# Patient Record
Sex: Female | Born: 1990 | Hispanic: No | Marital: Married | Smoking: Former smoker
Health system: Southern US, Community
[De-identification: ages and names within clinical notes are randomized; demographics above are authoritative.]

## PROBLEM LIST (undated history)

## (undated) DIAGNOSIS — G8929 Other chronic pain: Secondary | ICD-10-CM

## (undated) DIAGNOSIS — B009 Herpesviral infection, unspecified: Secondary | ICD-10-CM

## (undated) DIAGNOSIS — B999 Unspecified infectious disease: Secondary | ICD-10-CM

## (undated) DIAGNOSIS — O99013 Anemia complicating pregnancy, third trimester: Principal | ICD-10-CM

## (undated) DIAGNOSIS — M549 Dorsalgia, unspecified: Secondary | ICD-10-CM

## (undated) HISTORY — PX: NO PAST SURGERIES: SHX2092

## (undated) HISTORY — DX: Anemia complicating pregnancy, third trimester: O99.013

---
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
## 2012-02-09 ENCOUNTER — Emergency Department (HOSPITAL_COMMUNITY)
Admission: EM | Admit: 2012-02-09 | Discharge: 2012-02-09 | Disposition: A | Discharge status: Home / Self Care | Payer: Self-pay | Attending: Emergency Medicine | Admitting: Emergency Medicine

## 2012-02-09 ENCOUNTER — Encounter (HOSPITAL_COMMUNITY): Payer: Self-pay | Admitting: *Deleted

## 2012-02-09 DIAGNOSIS — G8929 Other chronic pain: Secondary | ICD-10-CM | POA: Insufficient documentation

## 2012-02-09 DIAGNOSIS — M549 Dorsalgia, unspecified: Secondary | ICD-10-CM | POA: Insufficient documentation

## 2012-02-09 DIAGNOSIS — Z043 Encounter for examination and observation following other accident: Secondary | ICD-10-CM | POA: Insufficient documentation

## 2012-02-09 HISTORY — DX: Dorsalgia, unspecified: M54.9

## 2012-02-09 HISTORY — DX: Other chronic pain: G89.29

## 2012-02-09 MED ORDER — HYDROCODONE-ACETAMINOPHEN 5-325 MG PO TABS
1.0000 | ORAL_TABLET | Freq: Once | ORAL | Status: AC
  Administered 2012-02-09: 1 via ORAL
  Filled 2012-02-09: qty 1

## 2012-02-09 MED ORDER — HYDROCODONE-ACETAMINOPHEN 5-325 MG PO TABS: 1.0000 | ORAL_TABLET | Freq: Four times a day (QID) | ORAL | Status: AC | PRN
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
## 2012-02-09 NOTE — ED Notes (Signed)
Discharge inst given voiced understanding. 

## 2014-05-25 ENCOUNTER — Emergency Department (HOSPITAL_COMMUNITY): Payer: No Typology Code available for payment source

## 2014-05-25 ENCOUNTER — Emergency Department (HOSPITAL_COMMUNITY)
Admission: EM | Admit: 2014-05-25 | Discharge: 2014-05-26 | Disposition: A | Discharge status: Home / Self Care | Payer: No Typology Code available for payment source | Attending: Emergency Medicine | Admitting: Emergency Medicine

## 2014-05-25 ENCOUNTER — Encounter (HOSPITAL_COMMUNITY): Payer: Self-pay | Admitting: Emergency Medicine

## 2014-05-25 DIAGNOSIS — IMO0002 Reserved for concepts with insufficient information to code with codable children: Secondary | ICD-10-CM | POA: Insufficient documentation

## 2014-05-25 DIAGNOSIS — S060X0A Concussion without loss of consciousness, initial encounter: Secondary | ICD-10-CM | POA: Diagnosis not present

## 2014-05-25 DIAGNOSIS — R42 Dizziness and giddiness: Secondary | ICD-10-CM | POA: Insufficient documentation

## 2014-05-25 DIAGNOSIS — G8929 Other chronic pain: Secondary | ICD-10-CM | POA: Diagnosis not present

## 2014-05-25 DIAGNOSIS — S161XXA Strain of muscle, fascia and tendon at neck level, initial encounter: Secondary | ICD-10-CM

## 2014-05-25 DIAGNOSIS — Y9389 Activity, other specified: Secondary | ICD-10-CM | POA: Insufficient documentation

## 2014-05-25 DIAGNOSIS — S139XXA Sprain of joints and ligaments of unspecified parts of neck, initial encounter: Secondary | ICD-10-CM | POA: Insufficient documentation

## 2014-05-25 DIAGNOSIS — Y92410 Unspecified street and highway as the place of occurrence of the external cause: Secondary | ICD-10-CM | POA: Diagnosis not present

## 2014-05-25 DIAGNOSIS — R110 Nausea: Secondary | ICD-10-CM | POA: Diagnosis not present

## 2014-05-25 DIAGNOSIS — S0990XA Unspecified injury of head, initial encounter: Secondary | ICD-10-CM | POA: Diagnosis present

## 2014-05-25 MED ORDER — KETOROLAC TROMETHAMINE 60 MG/2ML IM SOLN
60.0000 mg | Freq: Once | INTRAMUSCULAR | Status: AC
  Administered 2014-05-26: 60 mg via INTRAMUSCULAR
  Filled 2014-05-25: qty 2

## 2014-05-25 MED ORDER — HYDROCODONE-ACETAMINOPHEN 5-325 MG PO TABS
1.0000 | ORAL_TABLET | Freq: Once | ORAL | Status: AC
  Administered 2014-05-26: 1 via ORAL
  Filled 2014-05-25: qty 1
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
## 2014-05-25 NOTE — ED Notes (Signed)
Pt reports headache, facial pain, a pain in the back of her head and neck. Pt reports numbness and tingling, lower back pain. Pt reports pain 9/10. Pt denies taking any medications prior to arrival. Pt sitting in dark room and reports nausea. Pt given emesis bag.
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
## 2014-05-26 MED ORDER — CYCLOBENZAPRINE HCL 10 MG PO TABS: 10.0000 mg | ORAL_TABLET | Freq: Two times a day (BID) | ORAL | Status: DC | PRN

## 2014-05-26 MED ORDER — NAPROXEN 500 MG PO TABS
500.0000 mg | ORAL_TABLET | Freq: Two times a day (BID) | ORAL | Status: DC
Start: 2014-05-26 — End: 2014-07-22

## 2014-05-26 NOTE — ED Notes (Signed)
Discharge instructions reviewed with pt. Pt verbalized understanding.   
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
## 2014-05-27 NOTE — ED Provider Notes (Signed)
Medical screening examination/treatment/procedure(s) were performed by non-physician practitioner and as supervising physician I was immediately available for consultation/collaboration.   EKG Interpretation None        Richardean Canalavid H Harlowe Dowler, MD 05/27/14 [phone]

## 2014-06-05 ENCOUNTER — Emergency Department (HOSPITAL_COMMUNITY)
Admission: EM | Admit: 2014-06-05 | Discharge: 2014-06-05 | Disposition: A | Discharge status: Home / Self Care | Payer: No Typology Code available for payment source | Attending: Emergency Medicine | Admitting: Emergency Medicine

## 2014-06-05 ENCOUNTER — Encounter (HOSPITAL_COMMUNITY): Payer: Self-pay | Admitting: Emergency Medicine

## 2014-06-05 DIAGNOSIS — M549 Dorsalgia, unspecified: Secondary | ICD-10-CM | POA: Diagnosis not present

## 2014-06-05 DIAGNOSIS — Z791 Long term (current) use of non-steroidal anti-inflammatories (NSAID): Secondary | ICD-10-CM | POA: Diagnosis not present

## 2014-06-05 DIAGNOSIS — R519 Headache, unspecified: Secondary | ICD-10-CM

## 2014-06-05 DIAGNOSIS — M62838 Other muscle spasm: Secondary | ICD-10-CM | POA: Diagnosis not present

## 2014-06-05 DIAGNOSIS — G8929 Other chronic pain: Secondary | ICD-10-CM | POA: Insufficient documentation

## 2014-06-05 DIAGNOSIS — R51 Headache: Secondary | ICD-10-CM | POA: Diagnosis present

## 2014-06-05 DIAGNOSIS — Z79899 Other long term (current) drug therapy: Secondary | ICD-10-CM | POA: Insufficient documentation

## 2014-06-05 DIAGNOSIS — M542 Cervicalgia: Secondary | ICD-10-CM | POA: Diagnosis not present

## 2014-06-05 MED ORDER — IBUPROFEN 800 MG PO TABS: 800.0000 mg | ORAL_TABLET | Freq: Three times a day (TID) | ORAL | Status: DC | PRN

## 2014-06-05 MED ORDER — DIAZEPAM 5 MG PO TABS: 5.0000 mg | ORAL_TABLET | Freq: Three times a day (TID) | ORAL | Status: DC | PRN

## 2014-06-05 MED ORDER — KETOROLAC TROMETHAMINE 30 MG/ML IJ SOLN
30.0000 mg | Freq: Once | INTRAMUSCULAR | Status: AC
  Administered 2014-06-05: 30 mg via INTRAMUSCULAR
  Filled 2014-06-05: qty 1

## 2014-06-05 NOTE — ED Notes (Signed)
Declined W/C at D/C and was escorted to lobby by RN. 
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
## 2014-06-05 NOTE — ED Provider Notes (Signed)
Medical screening examination/treatment/procedure(s) were performed by non-physician practitioner and as supervising physician I was immediately available for consultation/collaboration  Morena Mckissack E Lenny Bouchillon, MD 06/05/14 1102 
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
## 2014-07-22 ENCOUNTER — Inpatient Hospital Stay (HOSPITAL_COMMUNITY)
Admission: AD | Admit: 2014-07-22 | Discharge: 2014-07-22 | Disposition: A | Discharge status: Home / Self Care | Payer: Medicaid Other | Source: Ambulatory Visit | Attending: Obstetrics & Gynecology | Admitting: Obstetrics & Gynecology

## 2014-07-22 ENCOUNTER — Encounter (HOSPITAL_COMMUNITY): Payer: Self-pay | Admitting: *Deleted

## 2014-07-22 ENCOUNTER — Inpatient Hospital Stay (HOSPITAL_COMMUNITY): Payer: Medicaid Other

## 2014-07-22 DIAGNOSIS — O209 Hemorrhage in early pregnancy, unspecified: Secondary | ICD-10-CM | POA: Diagnosis not present

## 2014-07-22 DIAGNOSIS — O4690 Antepartum hemorrhage, unspecified, unspecified trimester: Secondary | ICD-10-CM

## 2014-07-22 LAB — CBC WITH DIFFERENTIAL/PLATELET
BASOS PCT: 0 % (ref 0–1)
Basophils Absolute: 0 10*3/uL (ref 0.0–0.1)
EOS ABS: 0.1 10*3/uL (ref 0.0–0.7)
EOS PCT: 2 % (ref 0–5)
HEMATOCRIT: 36.4 % (ref 36.0–46.0)
HEMOGLOBIN: 12.4 g/dL (ref 12.0–15.0)
Lymphocytes Relative: 26 % (ref 12–46)
Lymphs Abs: 1.4 10*3/uL (ref 0.7–4.0)
MCH: 31.2 pg (ref 26.0–34.0)
MCHC: 34.1 g/dL (ref 30.0–36.0)
MCV: 91.5 fL (ref 78.0–100.0)
MONO ABS: 0.4 10*3/uL (ref 0.1–1.0)
MONOS PCT: 7 % (ref 3–12)
Neutro Abs: 3.7 10*3/uL (ref 1.7–7.7)
Neutrophils Relative %: 65 % (ref 43–77)
Platelets: 196 10*3/uL (ref 150–400)
RBC: 3.98 MIL/uL (ref 3.87–5.11)
RDW: 12.6 % (ref 11.5–15.5)
WBC: 5.6 10*3/uL (ref 4.0–10.5)

## 2014-07-22 LAB — ABO/RH: ABO/RH(D): B POS

## 2014-07-22 LAB — WET PREP, GENITAL
CLUE CELLS WET PREP: NONE SEEN
TRICH WET PREP: NONE SEEN
YEAST WET PREP: NONE SEEN

## 2014-07-22 LAB — POCT PREGNANCY, URINE: Preg Test, Ur: POSITIVE — AB

## 2014-07-22 LAB — HCG, QUANTITATIVE, PREGNANCY: hCG, Beta Chain, Quant, S: 343 m[IU]/mL — ABNORMAL HIGH (ref <5)

## 2014-07-22 NOTE — MAU Provider Note (Signed)
History     CSN: [id number]  Arrival date and time: 07/22/14 1123   First Provider Initiated Contact with Patient 07/22/14 1202      Chief Complaint  Patient presents with  . Threatened Miscarriage   HPI  Pt, Tracy Barr [redacted]w[redacted]d, is a 23 y.o. G57P0010 female with a one week history vaginal bleeding. Pt states that she began spotting about a week ago. Pt noticed an increase in the amount of bleeding within the past few days and also began to have cramps in her pelvic region and lower back. This morning pt states that she had a large amount of bleeding and she also noted the passage of what she thought to be blood clots. Pt has not had bleeding since this morning. She states pain has also improved since this morning and rates pain at 3/10 now. She denies fever, N/V/D or UTI symptoms.   OB History   Grav Para Term Preterm Abortions TAB SAB Ect Mult Living   2 0   1 1          Past Medical History  Diagnosis Date  . Chronic back pain     Past Surgical History  Procedure Laterality Date  . No past surgeries      History reviewed. No pertinent family history.  History  Substance Use Topics  . Smoking status: Never Smoker   . Smokeless tobacco: Not on file  . Alcohol Use: No    Allergies: No Known Allergies  Prescriptions prior to admission  Medication Sig Dispense Refill  . Prenatal Vit-Fe Fumarate-FA (PRENATAL MULTIVITAMIN) TABS tablet Take 1 tablet by mouth daily at 12 noon.        Review of Systems  Constitutional: Negative for fever, chills, malaise/fatigue and diaphoresis.  Eyes: Negative for blurred vision and double vision.  Respiratory: Negative for cough and shortness of breath.   Cardiovascular: Negative for chest pain and palpitations.  Gastrointestinal: Positive for abdominal pain. Negative for nausea, vomiting, diarrhea and constipation.  Genitourinary: Negative for dysuria, urgency and frequency.  Neurological: Negative for dizziness, weakness and  headaches.   Physical Exam   Blood pressure 103/65, pulse 71, temperature 98.6 F (37 C), temperature source Oral, resp. rate 18, height  (1.626 m), weight 139 lb 12.8 oz (63.413 kg), last menstrual period 05/19/2014.  Physical Exam  Constitutional: She is oriented to person, place, and time. She appears well-developed and well-nourished.  HENT:  Head: Normocephalic and atraumatic.  Cardiovascular: Normal rate, regular rhythm, normal heart sounds and intact distal pulses.   No murmur heard. Respiratory: Effort normal and breath sounds normal. No respiratory distress.  GI: Soft. She exhibits no distension. There is tenderness. There is guarding.  Some reflexive guarding noted upon deep palpation of the lower quadrants  Genitourinary: Uterus is enlarged (slightly). Uterus is not tender. Cervix exhibits no motion tenderness, no discharge and no friability. Right adnexum displays no mass and no tenderness. Left adnexum displays no mass and no tenderness. There is bleeding (small blood noted in the vaginal vault) around the vagina. No vaginal discharge found.  Neurological: She is alert and oriented to person, place, and time.  Skin: Skin is warm.  Psychiatric: Thought content normal.   Results for orders placed during the hospital encounter of 07/22/14 (from the past 24 hour(s))  POCT PREGNANCY, URINE     Status: Abnormal   Collection Time    07/22/14 12:04 PM      Result Value Ref Range  Preg Test, Ur POSITIVE (*) NEGATIVE  WET PREP, GENITAL     Status: Abnormal   Collection Time    07/22/14 12:10 PM      Result Value Ref Range   Yeast Wet Prep HPF POC NONE SEEN  NONE SEEN   Trich, Wet Prep NONE SEEN  NONE SEEN   Clue Cells Wet Prep HPF POC NONE SEEN  NONE SEEN   WBC, Wet Prep HPF POC FEW (*) NONE SEEN  CBC WITH DIFFERENTIAL     Status: None   Collection Time    07/22/14 12:25 PM      Result Value Ref Range   WBC 5.6  4.0 - 10.5 K/uL   RBC 3.98  3.87 - 5.11 MIL/uL    Hemoglobin 12.4  12.0 - 15.0 g/dL   HCT 16.1  09.6 - 04.5 %   MCV 91.5  78.0 - 100.0 fL   MCH 31.2  26.0 - 34.0 pg   MCHC 34.1  30.0 - 36.0 g/dL   RDW 40.9  81.1 - 91.4 %   Platelets 196  150 - 400 K/uL   Neutrophils Relative % 65  43 - 77 %   Neutro Abs 3.7  1.7 - 7.7 K/uL   Lymphocytes Relative 26  12 - 46 %   Lymphs Abs 1.4  0.7 - 4.0 K/uL   Monocytes Relative 7  3 - 12 %   Monocytes Absolute 0.4  0.1 - 1.0 K/uL   Eosinophils Relative 2  0 - 5 %   Eosinophils Absolute 0.1  0.0 - 0.7 K/uL   Basophils Relative 0  0 - 1 %   Basophils Absolute 0.0  0.0 - 0.1 K/uL  ABO/RH     Status: None   Collection Time    07/22/14 12:25 PM      Result Value Ref Range   ABO/RH(D) B POS    HCG, QUANTITATIVE, PREGNANCY     Status: Abnormal   Collection Time    07/22/14 12:25 PM      Result Value Ref Range   hCG, Beta Chain, Quant, S 343 (*) <5 mIU/mL   US Ob Comp Less 14 Wks  07/22/2014   CLINICAL DATA:  Pelvic and back pain.  Vaginal bleeding.  EXAM: OBSTETRIC <14 WK [place] AND TRANSVAGINAL OB US  TECHNIQUE: Both transabdominal and transvaginal ultrasound examinations were performed for complete evaluation of the gestation as well as the maternal uterus, adnexal regions, and pelvic cul-de-sac. Transvaginal technique was performed to assess early pregnancy.  COMPARISON:  None.  FINDINGS: No intrauterine gestational sac or other fluid collection visualized in endometrial cavity. Endometrial thickness measures approximately 6 mm transvaginally.  Both ovaries are normal in appearance. No adnexal mass or free fluid identified.  IMPRESSION: No IUP or adnexal mass visualized. Differential diagnosis includes recent spontaneous abortion, IUP too early to visualize, and occult ectopic pregnancy. Recommend close follow up of quantitative B-HCG levels, and follow up US as clinically warranted.   Electronically Signed   By: Myles Rosenthal M.D.   On: 07/22/2014 13:54   US Ob Transvaginal  07/22/2014   CLINICAL DATA:   Pelvic and back pain.  Vaginal bleeding.  EXAM: OBSTETRIC <14 WK [place] AND TRANSVAGINAL OB US  TECHNIQUE: Both transabdominal and transvaginal ultrasound examinations were performed for complete evaluation of the gestation as well as the maternal uterus, adnexal regions, and pelvic cul-de-sac. Transvaginal technique was performed to assess early pregnancy.  COMPARISON:  None.  FINDINGS: No intrauterine  gestational sac or other fluid collection visualized in endometrial cavity. Endometrial thickness measures approximately 6 mm transvaginally.  Both ovaries are normal in appearance. No adnexal mass or free fluid identified.  IMPRESSION: No IUP or adnexal mass visualized. Differential diagnosis includes recent spontaneous abortion, IUP too early to visualize, and occult ectopic pregnancy. Recommend close follow up of quantitative B-HCG levels, and follow up US as clinically warranted.   Electronically Signed   By: Myles Rosenthal M.D.   On: 07/22/2014 13:54    MAU Course  Procedures None  MDM +UPT UA, wet prep, GC/Chlamydia, CBC, ABO/Rh, quant hCG, HIV and [place] today  Azucena Cecil, PA-S2 07/22/2014, 12:02 PM  Assessment and Plan  A: Vaginal bleeding in pregnancy prior to [redacted] weeks gestation  P: Discharge home Bleeding precautions discussed Patient to follow-up in MAU in 48 hours for repeat labs Patient may return to MAU as needed or if her condition were to change or worsen  I have seen and evaluated the patient with the NP/PA/Med student. I agree with the assessment and plan as written above.   Marny Lowenstein, PA-C  07/22/2014 2:47 PM

## 2014-07-22 NOTE — Discharge Instructions (Signed)
Pelvic Rest °Pelvic rest is sometimes recommended for women when:  °· The placenta is partially or completely covering the opening of the cervix (placenta previa). °· There is bleeding between the uterine wall and the amniotic sac in the first trimester (subchorionic hemorrhage). °· The cervix begins to open without labor starting (incompetent cervix, cervical insufficiency). °· The labor is too early (preterm labor). °HOME CARE INSTRUCTIONS °· Do not have sexual intercourse, stimulation, or an orgasm. °· Do not use tampons, douche, or put anything in the vagina. °· Do not lift anything over 10 pounds (4.5 kg). °· Avoid strenuous activity or straining your pelvic muscles. °SEEK MEDICAL CARE IF:  °· You have any vaginal bleeding during pregnancy. Treat this as a potential emergency. °· You have cramping pain felt low in the stomach (stronger than menstrual cramps). °· You notice vaginal discharge (watery, mucus, or bloody). °· You have a low, dull backache. °· There are regular contractions or uterine tightening. °SEEK IMMEDIATE MEDICAL CARE IF: °You have vaginal bleeding and have placenta previa.  °Document Released: 02/16/2011 Document Revised: 01/14/2012 Document Reviewed: 02/16/2011 °ExitCare® Patient Information ©2015 [employer]. This information is not intended to replace advice given to you by your health care provider. Make sure you discuss any questions you have with your health care provider. ° °Threatened Miscarriage °A threatened miscarriage is when you have vaginal bleeding during your first 20 weeks of pregnancy but the pregnancy has not ended. Your doctor will do tests to make sure you are still pregnant. The cause of the bleeding may not be known. This condition does not mean your pregnancy will end. It does increase the risk of it ending (complete miscarriage). °HOME CARE  °· Make sure you keep all your doctor visits for prenatal care. °· Get plenty of rest. °· Do not have sex or use tampons if  you have vaginal bleeding. °· Do not douche. °· Do not smoke or use drugs. °· Do not drink alcohol. °· Avoid caffeine. °GET HELP IF: °· You have light bleeding from your vagina. °· You have belly pain or cramping. °· You have a fever. °GET HELP RIGHT AWAY IF:  °· You have heavy bleeding from your vagina. °· You have clots of blood coming from your vagina. °· You have bad pain or cramps in your low back or belly. °· You have fever, chills, and bad belly pain. °MAKE SURE YOU:  °· Understand these instructions. °· Will watch your condition. °· Will get help right away if you are not doing well or get worse. °Document Released: 10/04/2008 Document Revised: 10/27/2013 Document Reviewed: 08/18/2013 °ExitCare® Patient Information ©2015 [employer]. This information is not intended to replace advice given to you by your health care provider. Make sure you discuss any questions you have with your health care provider. ° °

## 2014-07-22 NOTE — MAU Provider Note (Signed)
Attestation of Attending Supervision of Advanced Practitioner (CNM/NP): Evaluation and management procedures were performed by the Advanced Practitioner under my supervision and collaboration.  I have reviewed the Advanced Practitioner's note and chart, and I agree with the management and plan.  HARRAWAY-SMITH, Mila Pair 5:12 PM     

## 2014-07-22 NOTE — MAU Note (Signed)
[facility]/14. Started having some spotting a week ago. Bleeding got a liltle heavier and stred having some cramping increased yesterday. Feels like she passed something this morning.

## 2014-07-23 LAB — GC/CHLAMYDIA PROBE AMP
CT Probe RNA: NEGATIVE
GC Probe RNA: NEGATIVE

## 2014-07-23 LAB — HIV ANTIBODY (ROUTINE TESTING W REFLEX): HIV: NONREACTIVE

## 2014-07-24 ENCOUNTER — Inpatient Hospital Stay (HOSPITAL_COMMUNITY)
Admission: AD | Admit: 2014-07-24 | Discharge: 2014-07-24 | Disposition: A | Discharge status: Home / Self Care | Payer: Medicaid Other | Source: Ambulatory Visit | Attending: Obstetrics & Gynecology | Admitting: Obstetrics & Gynecology

## 2014-07-24 DIAGNOSIS — O039 Complete or unspecified spontaneous abortion without complication: Secondary | ICD-10-CM | POA: Insufficient documentation

## 2014-07-24 DIAGNOSIS — O4690 Antepartum hemorrhage, unspecified, unspecified trimester: Secondary | ICD-10-CM

## 2014-07-24 DIAGNOSIS — O209 Hemorrhage in early pregnancy, unspecified: Secondary | ICD-10-CM

## 2014-07-24 LAB — HCG, QUANTITATIVE, PREGNANCY: hCG, Beta Chain, Quant, S: 86 m[IU]/mL — ABNORMAL HIGH (ref <5)

## 2014-07-24 NOTE — MAU Note (Signed)
Pt here for repeat BHCG. Intermittent mild cramping, still bleeding, however has lessened.

## 2014-07-24 NOTE — MAU Provider Note (Signed)
  History     CSN: [id number]  Arrival date and time: 07/24/14 1209   None     Chief Complaint  Patient presents with  . Follow-Up BHCG    HPI Tracy Barr is a 23 y.o. G2P0010 at [redacted]w[redacted]d. Her first MAU visit was 9/17 for bleeding and cramping. BHCG was 343, U/S showed no IUGS or adnexal masses. She returns for repeat BHCG today. She has light bleeding and mild cramping. OB History   Grav Para Term Preterm Abortions TAB SAB Ect Mult Living   2 0   1 1          Past Medical History  Diagnosis Date  . Chronic back pain     Past Surgical History  Procedure Laterality Date  . No past surgeries      No family history on file.  History  Substance Use Topics  . Smoking status: Never Smoker   . Smokeless tobacco: Not on file  . Alcohol Use: No    Allergies: No Known Allergies  Prescriptions prior to admission  Medication Sig Dispense Refill  . Prenatal Vit-Fe Fumarate-FA (PRENATAL MULTIVITAMIN) TABS tablet Take 1 tablet by mouth daily at 12 noon.        Review of Systems  Constitutional: Negative for fever and chills.  Genitourinary:       Occ cramping, light spotting  Neurological: Negative for dizziness.   Physical Exam   Blood pressure 110/59, pulse 80, temperature 98.8 F (37.1 C), temperature source Oral, resp. rate 16, height  (1.626 m), weight 64.071 kg (141 lb 4 oz), last menstrual period 05/19/2014.  Physical Exam  Nursing note and vitals reviewed. Constitutional: She is oriented to person, place, and time. She appears well-developed and well-nourished.  Musculoskeletal: Normal range of motion.  Neurological: She is alert and oriented to person, place, and time.  Skin: Skin is warm and dry.  Psychiatric: She has a normal mood and affect. Her behavior is normal.    MAU Course  Procedures  MDM Results for orders placed during the hospital encounter of 07/24/14 (from the past 24 hour(s))  HCG, QUANTITATIVE, PREGNANCY     Status: Abnormal   Collection Time    07/24/14 12:19 PM      Result Value Ref Range   hCG, Beta Chain, Quant, S 86 (*) <5 mIU/mL     Assessment and Plan  Spont ab    F/U in GYN clinic next Monday for BHCG Precautions reviewed Continue PNV  Anjalee Cope M. 07/24/2014, 1:05 PM

## 2014-08-02 ENCOUNTER — Other Ambulatory Visit: Payer: Medicaid Other

## 2014-08-02 DIAGNOSIS — O039 Complete or unspecified spontaneous abortion without complication: Secondary | ICD-10-CM

## 2014-08-02 LAB — HCG, QUANTITATIVE, PREGNANCY: hCG, Beta Chain, Quant, S: 2.79 m[IU]/mL

## 2014-08-04 ENCOUNTER — Telehealth: Payer: Self-pay | Admitting: General Practice

## 2014-08-04 NOTE — Telephone Encounter (Signed)
Message copied by Kathee DeltonHILLMAN, CARRIE L on Wed Aug 04, 2014  9:51 AM ------      Message from: CONSTANT, PEGGY      Created: Wed Aug 04, 2014  7:33 AM       PLease inform patient of complete resolution of pregnancy            Thanks            Peggy ------

## 2014-08-04 NOTE — Telephone Encounter (Signed)
Called patient and informed her of results. Patient verbalized understanding and had no other questions 

## 2014-08-04 NOTE — Telephone Encounter (Signed)
Called patient, no answer- left message that we are trying to reach you with some results, nothing urgent but please call us back at the clinics 

## 2014-09-06 ENCOUNTER — Encounter (HOSPITAL_COMMUNITY): Payer: Self-pay | Admitting: *Deleted

## 2014-11-05 NOTE — L&D Delivery Note (Signed)
Delivery Note 1954: Nurse call requests assistance.  In room and patient laying in bed on side.  Introductions made and patient with urge to push.  Infant noted at introitus and given option to birth in bed or tub.  Patient into tub and delivered as below with staff support and family encouragement.   At 8:31 PM a viable female "[name]" was delivered via Vaginal, Spontaneous Delivery in the WB tub (Presentation: Left Occiput Anterior with compound right hand).  Shoulders delivered easily and infant with good tone and spontaneous cry. Tactile stimulation given by provider and infant placed on mother's abdomen where nurse continued tactile stimulation. Infant APGAR: 8, 9. Mother guided out of tub, dried, and assisted into bed.  Cord clamped, cut, and blood collected. Placenta delivered spontaneously and noted to be intact with 3VC upon inspection.  Vaginal inspection revealed a right periurethral and left labial lacerations that was repaired with 3-0 vicryl on SH needle.  Patient straight catheterized prior to procedure with ~34950mL of clear yellow urine noted. Lidocaine 1% was utilized for local anesthetic and patient tolerated the procedure well. Fundus firm, at the umbilicus, and bleeding small.  Mother hemodynamically stable and infant skin to skin prior to provider exit.  Mother desires NFP for birth control and opts to breastfeed.  Infant weight at one hour of life: 7lbs 4.4oz, 20in  Anesthesia: Local  Episiotomy: None Lacerations: Labial Suture Repair: 3.0 vicryl Est. Blood Loss (mL): 150  Mom to postpartum.  Baby to Couplet care / Skin to Skin.  Tracy Blust LYNN MSN, CNM 09/18/2015, 10:10 PM

## 2015-02-22 LAB — OB RESULTS CONSOLE GC/CHLAMYDIA
Chlamydia: NEGATIVE
Gonorrhea: NEGATIVE

## 2015-02-22 LAB — OB RESULTS CONSOLE RPR: RPR: NONREACTIVE

## 2015-02-22 LAB — OB RESULTS CONSOLE HIV ANTIBODY (ROUTINE TESTING): HIV: NONREACTIVE

## 2015-02-22 LAB — OB RESULTS CONSOLE HEPATITIS B SURFACE ANTIGEN: HEP B S AG: NEGATIVE

## 2015-02-22 LAB — OB RESULTS CONSOLE RUBELLA ANTIBODY, IGM: RUBELLA: IMMUNE

## 2015-04-20 IMAGING — US US OB COMP LESS 14 WK
1 series · 14 of 28 positions shown · non-contrast
Comparison: None.

CLINICAL DATA: Pelvic and back pain.  Vaginal bleeding.

EXAM:
OBSTETRIC <14 WK US AND TRANSVAGINAL OB US
TECHNIQUE: Both transabdominal and transvaginal ultrasound examinations were
performed for complete evaluation of the gestation as well as the
maternal uterus, adnexal regions, and pelvic cul-de-sac.
Transvaginal technique was performed to assess early pregnancy.

[Series 1: us ob comp less 14 wks · 14 of 68 slices shown]
[im 3/68]
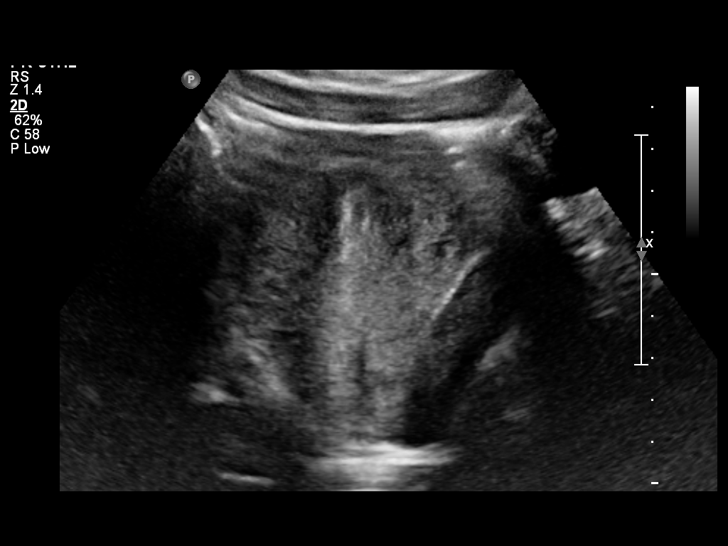
[im 8/68]
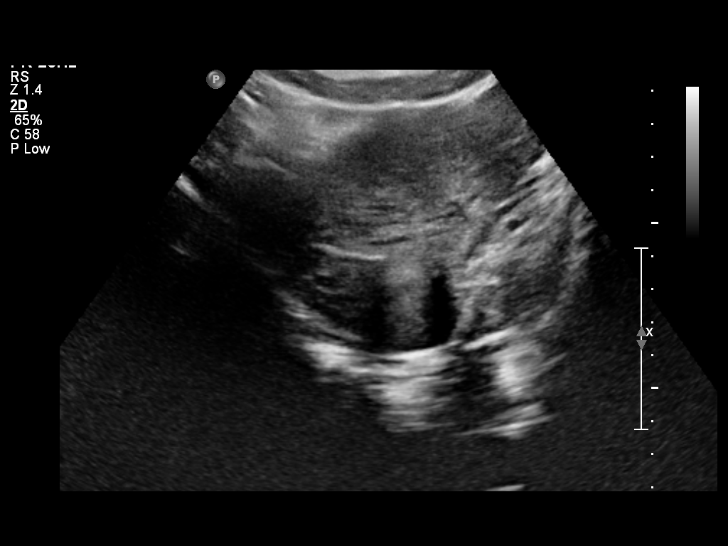
[im 13/68]
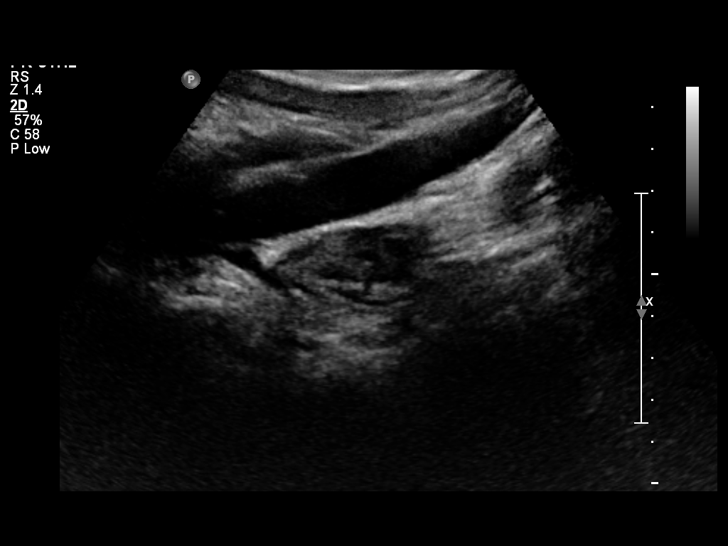
[im 18/68]
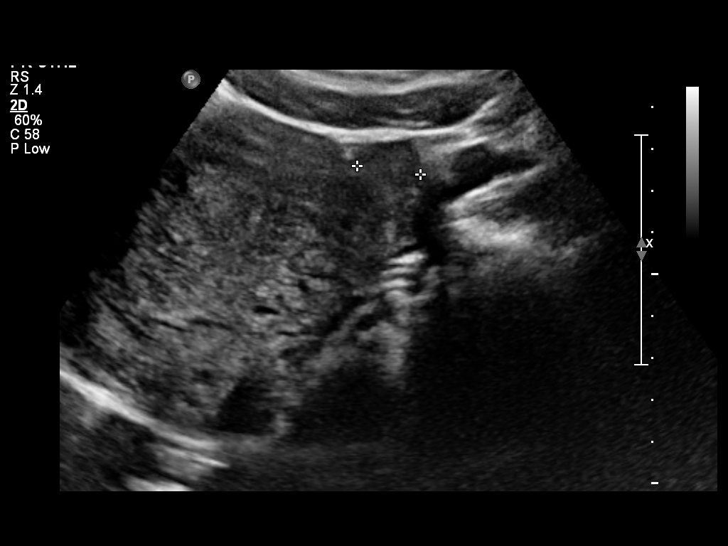
[im 23/68]
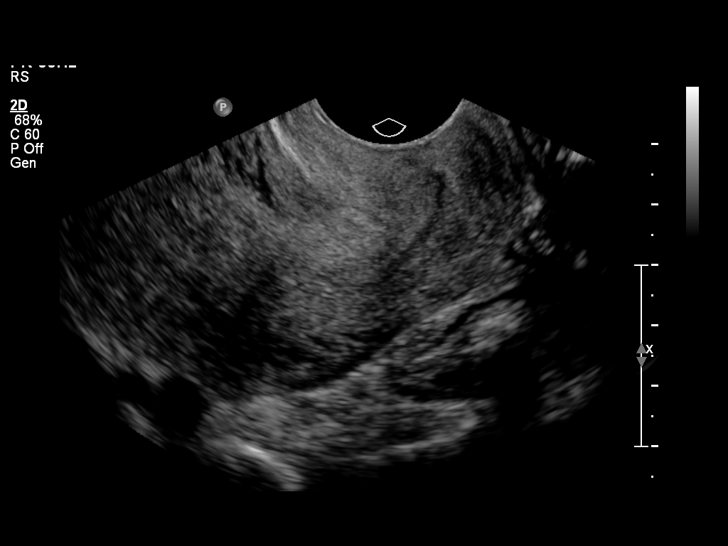
[im 28/68]
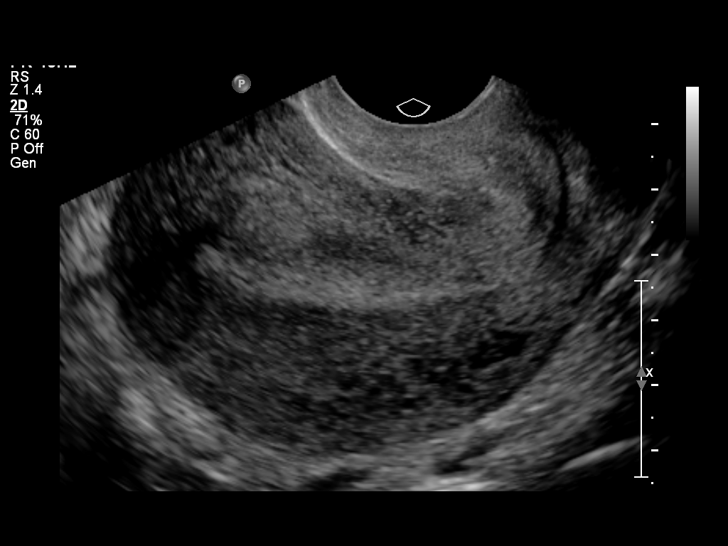
[im 33/68]
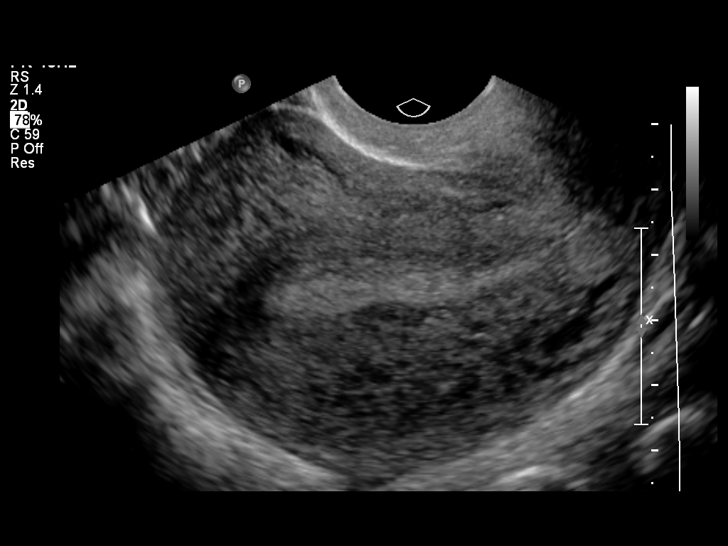
[im 38/68]
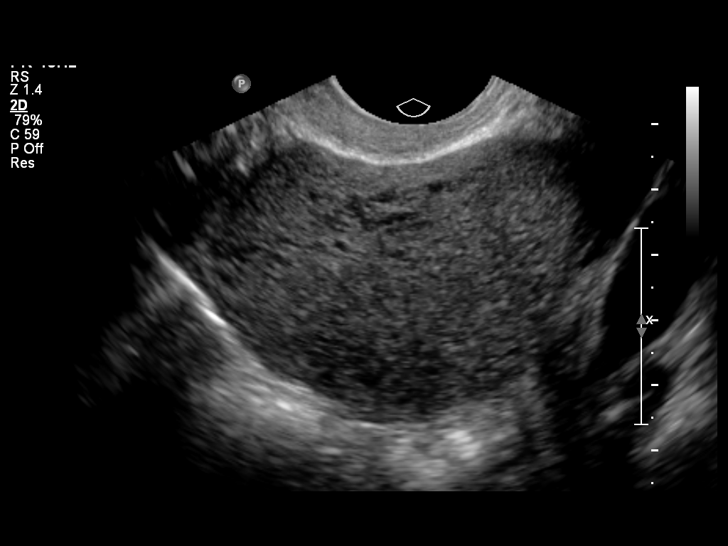
[im 43/68]
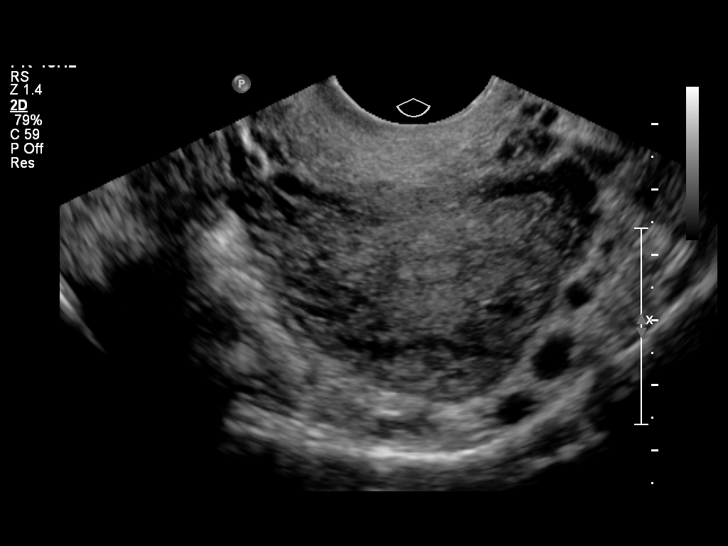
[im 48/68]
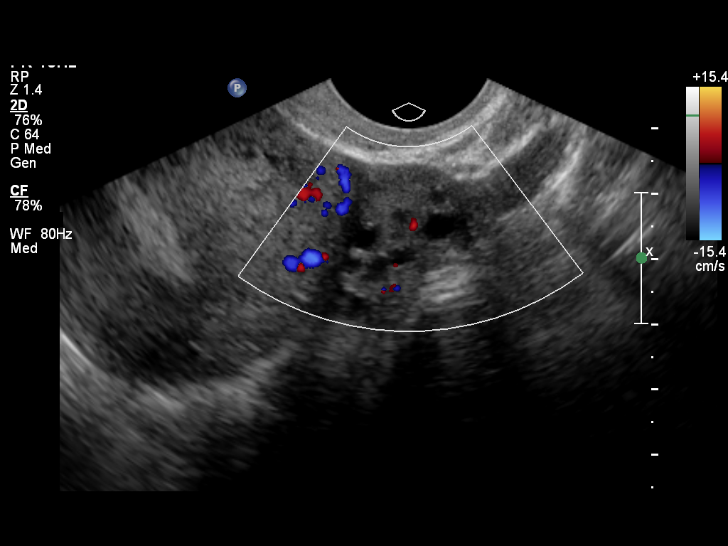
[im 53/68]
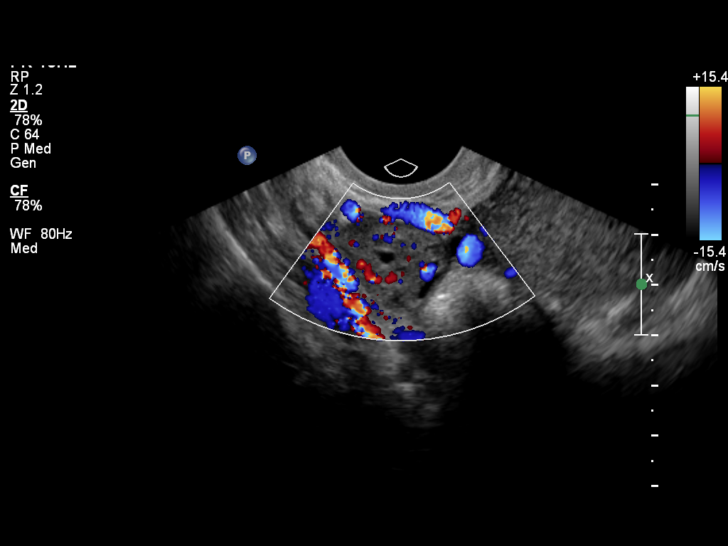
[im 58/68]
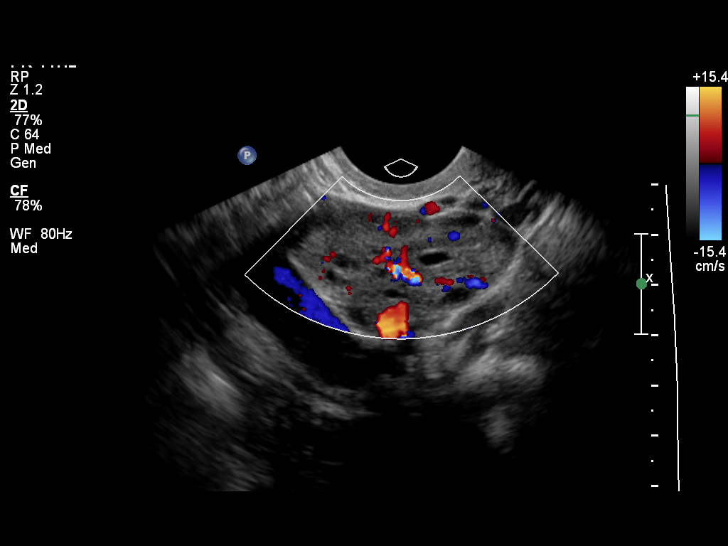
[im 63/68]
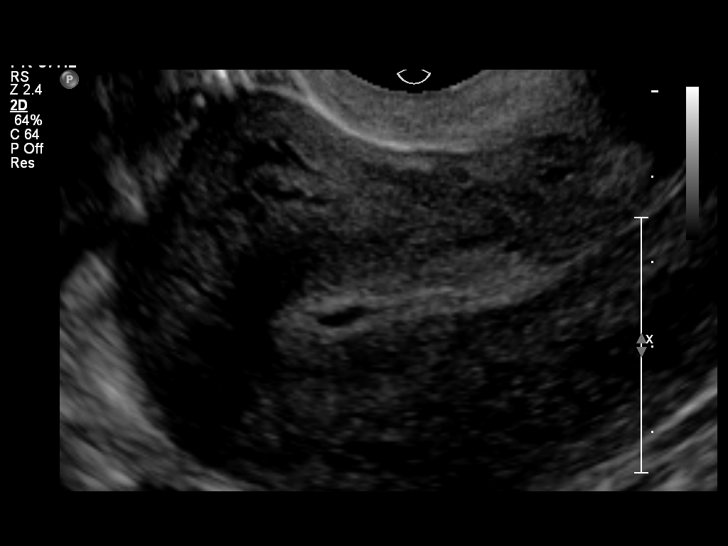
[im 68/68]
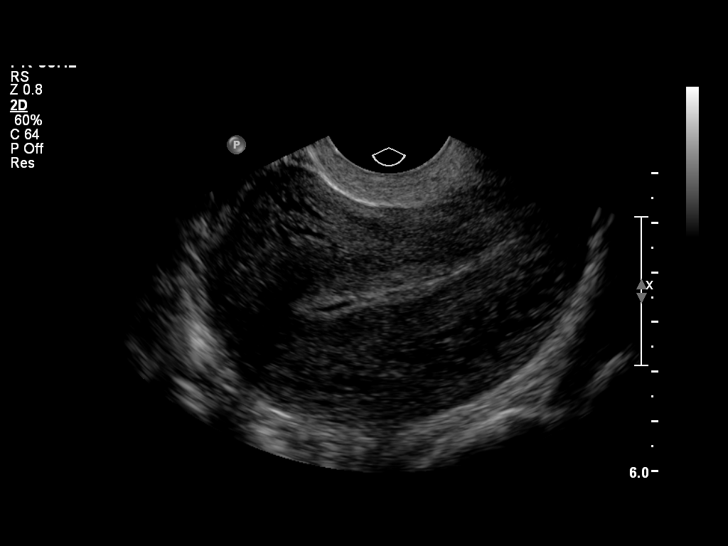

[14 of 28 positions shown; findings below may reference images not displayed]

FINDINGS: No intrauterine gestational sac or other fluid collection visualized
in endometrial cavity. Endometrial thickness measures approximately
6 mm transvaginally.

Both ovaries are normal in appearance. No adnexal mass or free fluid
identified.
IMPRESSION: No IUP or adnexal mass visualized. Differential diagnosis includes
recent spontaneous abortion, IUP too early to visualize, and occult
ectopic pregnancy. Recommend close follow up of quantitative B-HCG
levels, and follow up US as clinically warranted.

## 2015-05-27 ENCOUNTER — Encounter (HOSPITAL_COMMUNITY): Payer: Self-pay | Admitting: *Deleted

## 2015-07-21 LAB — OB RESULTS CONSOLE GBS: GBS: POSITIVE

## 2015-09-18 ENCOUNTER — Inpatient Hospital Stay (HOSPITAL_COMMUNITY)
Admission: AD | Admit: 2015-09-18 | Discharge: 2015-09-20 | DRG: 774 | Disposition: A | Discharge status: Home / Self Care | Payer: Medicaid Other | Attending: Obstetrics and Gynecology | Admitting: Obstetrics and Gynecology

## 2015-09-18 ENCOUNTER — Encounter (HOSPITAL_COMMUNITY): Payer: Self-pay | Admitting: *Deleted

## 2015-09-18 DIAGNOSIS — Z833 Family history of diabetes mellitus: Secondary | ICD-10-CM | POA: Diagnosis not present

## 2015-09-18 DIAGNOSIS — Z87891 Personal history of nicotine dependence: Secondary | ICD-10-CM | POA: Diagnosis not present

## 2015-09-18 DIAGNOSIS — B951 Streptococcus, group B, as the cause of diseases classified elsewhere: Secondary | ICD-10-CM | POA: Diagnosis present

## 2015-09-18 DIAGNOSIS — O480 Post-term pregnancy: Secondary | ICD-10-CM | POA: Diagnosis present

## 2015-09-18 DIAGNOSIS — B009 Herpesviral infection, unspecified: Secondary | ICD-10-CM | POA: Diagnosis present

## 2015-09-18 DIAGNOSIS — O9852 Other viral diseases complicating childbirth: Secondary | ICD-10-CM | POA: Diagnosis present

## 2015-09-18 DIAGNOSIS — Z8249 Family history of ischemic heart disease and other diseases of the circulatory system: Secondary | ICD-10-CM | POA: Diagnosis not present

## 2015-09-18 DIAGNOSIS — Z3A40 40 weeks gestation of pregnancy: Secondary | ICD-10-CM

## 2015-09-18 DIAGNOSIS — O99824 Streptococcus B carrier state complicating childbirth: Secondary | ICD-10-CM | POA: Diagnosis present

## 2015-09-18 HISTORY — DX: Unspecified infectious disease: B99.9

## 2015-09-18 HISTORY — DX: Herpesviral infection, unspecified: B00.9

## 2015-09-18 LAB — TYPE AND SCREEN
ABO/RH(D): B POS
Antibody Screen: NEGATIVE

## 2015-09-18 LAB — RPR: RPR: NONREACTIVE

## 2015-09-18 LAB — CBC
HCT: 34.7 % — ABNORMAL LOW (ref 36.0–46.0)
Hemoglobin: 12.1 g/dL (ref 12.0–15.0)
MCH: 32.5 pg (ref 26.0–34.0)
MCHC: 34.9 g/dL (ref 30.0–36.0)
MCV: 93.3 fL (ref 78.0–100.0)
PLATELETS: 201 10*3/uL (ref 150–400)
RBC: 3.72 MIL/uL — AB (ref 3.87–5.11)
RDW: 13.2 % (ref 11.5–15.5)
WBC: 16.8 10*3/uL — AB (ref 4.0–10.5)

## 2015-09-18 MED ORDER — OXYCODONE-ACETAMINOPHEN 5-325 MG PO TABS: 1.0000 | ORAL_TABLET | ORAL | Status: DC | PRN

## 2015-09-18 MED ORDER — ACETAMINOPHEN 325 MG PO TABS: 650.0000 mg | ORAL_TABLET | ORAL | Status: DC | PRN

## 2015-09-18 MED ORDER — SODIUM CHLORIDE 0.9 % IV SOLN
2.0000 g | Freq: Once | INTRAVENOUS | Status: AC
  Administered 2015-09-18: 2 g via INTRAVENOUS
  Filled 2015-09-18: qty 2000

## 2015-09-18 MED ORDER — ONDANSETRON HCL 4 MG/2ML IJ SOLN: 4.0000 mg | Freq: Four times a day (QID) | INTRAMUSCULAR | Status: DC | PRN

## 2015-09-18 MED ORDER — PRENATAL MULTIVITAMIN CH: 1.0000 | ORAL_TABLET | Freq: Every day | ORAL | Status: DC

## 2015-09-18 MED ORDER — BENZOCAINE-MENTHOL 20-0.5 % EX AERO
1.0000 | INHALATION_SPRAY | CUTANEOUS | Status: DC | PRN
Start: 2015-09-18 — End: 2015-09-20
  Filled 2015-09-18: qty 56

## 2015-09-18 MED ORDER — DOCUSATE SODIUM 100 MG PO CAPS: 100.0000 mg | ORAL_CAPSULE | Freq: Every day | ORAL | Status: DC

## 2015-09-18 MED ORDER — OXYCODONE-ACETAMINOPHEN 5-325 MG PO TABS: 2.0000 | ORAL_TABLET | ORAL | Status: DC | PRN

## 2015-09-18 MED ORDER — DEXTROSE 5 % IV SOLN
2.5000 10*6.[IU] | INTRAVENOUS | Status: DC
  Filled 2015-09-18 (×6): qty 2.5

## 2015-09-18 MED ORDER — LACTATED RINGERS IV SOLN: 500.0000 mL | INTRAVENOUS | Status: DC | PRN

## 2015-09-18 MED ORDER — ZOLPIDEM TARTRATE 5 MG PO TABS: 5.0000 mg | ORAL_TABLET | Freq: Every evening | ORAL | Status: DC | PRN

## 2015-09-18 MED ORDER — DIPHENHYDRAMINE HCL 25 MG PO CAPS: 25.0000 mg | ORAL_CAPSULE | Freq: Four times a day (QID) | ORAL | Status: DC | PRN

## 2015-09-18 MED ORDER — PENICILLIN G POTASSIUM 5000000 UNITS IJ SOLR
2.5000 10*6.[IU] | INTRAVENOUS | Status: DC
  Administered 2015-09-18: 2.5 10*6.[IU] via INTRAVENOUS
  Filled 2015-09-18 (×7): qty 2.5

## 2015-09-18 MED ORDER — SENNOSIDES-DOCUSATE SODIUM 8.6-50 MG PO TABS
2.0000 | ORAL_TABLET | ORAL | Status: DC
  Administered 2015-09-19 (×2): 2 via ORAL
  Filled 2015-09-18 (×2): qty 2

## 2015-09-18 MED ORDER — LANOLIN HYDROUS EX OINT: TOPICAL_OINTMENT | CUTANEOUS | Status: DC | PRN

## 2015-09-18 MED ORDER — LACTATED RINGERS IV SOLN
INTRAVENOUS | Status: DC
  Administered 2015-09-18: 09:00:00 via INTRAVENOUS

## 2015-09-18 MED ORDER — SODIUM CHLORIDE 0.9 % IJ SOLN: 3.0000 mL | Freq: Two times a day (BID) | INTRAMUSCULAR | Status: DC

## 2015-09-18 MED ORDER — NALBUPHINE HCL 10 MG/ML IJ SOLN: 5.0000 mg | INTRAMUSCULAR | Status: DC | PRN

## 2015-09-18 MED ORDER — SODIUM CHLORIDE 0.9 % IV SOLN: 250.0000 mL | INTRAVENOUS | Status: DC | PRN

## 2015-09-18 MED ORDER — OXYTOCIN BOLUS FROM INFUSION
500.0000 mL | INTRAVENOUS | Status: DC
  Administered 2015-09-18: 500 mL via INTRAVENOUS

## 2015-09-18 MED ORDER — ONDANSETRON HCL 4 MG/2ML IJ SOLN: 4.0000 mg | INTRAMUSCULAR | Status: DC | PRN

## 2015-09-18 MED ORDER — LIDOCAINE HCL (PF) 1 % IJ SOLN
30.0000 mL | INTRAMUSCULAR | Status: DC | PRN
  Administered 2015-09-18: 30 mL via SUBCUTANEOUS
  Filled 2015-09-18: qty 30

## 2015-09-18 MED ORDER — PENICILLIN G POTASSIUM 5000000 UNITS IJ SOLR
5.0000 10*6.[IU] | Freq: Once | INTRAMUSCULAR | Status: DC
  Filled 2015-09-18: qty 5

## 2015-09-18 MED ORDER — CITRIC ACID-SODIUM CITRATE 334-500 MG/5ML PO SOLN: 30.0000 mL | ORAL | Status: DC | PRN

## 2015-09-18 MED ORDER — IBUPROFEN 600 MG PO TABS
600.0000 mg | ORAL_TABLET | Freq: Four times a day (QID) | ORAL | Status: DC
  Administered 2015-09-19 – 2015-09-20 (×8): 600 mg via ORAL
  Filled 2015-09-18 (×9): qty 1

## 2015-09-18 MED ORDER — SODIUM CHLORIDE 0.9 % IJ SOLN: 3.0000 mL | INTRAMUSCULAR | Status: DC | PRN

## 2015-09-18 MED ORDER — WITCH HAZEL-GLYCERIN EX PADS: 1.0000 "application " | MEDICATED_PAD | CUTANEOUS | Status: DC | PRN

## 2015-09-18 MED ORDER — DIBUCAINE 1 % RE OINT
1.0000 | TOPICAL_OINTMENT | RECTAL | Status: DC | PRN
Start: 2015-09-18 — End: 2015-09-20

## 2015-09-18 MED ORDER — BETAMETHASONE SOD PHOS & ACET 6 (3-3) MG/ML IJ SUSP: 12.0000 mg | Freq: Once | INTRAMUSCULAR | Status: DC

## 2015-09-18 MED ORDER — TETANUS-DIPHTH-ACELL PERTUSSIS 5-2.5-18.5 LF-MCG/0.5 IM SUSP: 0.5000 mL | Freq: Once | INTRAMUSCULAR | Status: DC

## 2015-09-18 MED ORDER — PENICILLIN G POTASSIUM 5000000 UNITS IJ SOLR
2.5000 10*6.[IU] | INTRAVENOUS | Status: DC
  Filled 2015-09-18 (×3): qty 2.5

## 2015-09-18 MED ORDER — PRENATAL MULTIVITAMIN CH
1.0000 | ORAL_TABLET | Freq: Every day | ORAL | Status: DC
  Filled 2015-09-18: qty 1

## 2015-09-18 MED ORDER — ONDANSETRON HCL 4 MG PO TABS: 4.0000 mg | ORAL_TABLET | ORAL | Status: DC | PRN

## 2015-09-18 MED ORDER — OXYTOCIN 40 UNITS IN LACTATED RINGERS INFUSION - SIMPLE MED
62.5000 mL/h | INTRAVENOUS | Status: DC
  Filled 2015-09-18: qty 1000

## 2015-09-18 MED ORDER — SIMETHICONE 80 MG PO CHEW: 80.0000 mg | CHEWABLE_TABLET | ORAL | Status: DC | PRN

## 2015-09-18 MED ORDER — PENICILLIN G POTASSIUM 5000000 UNITS IJ SOLR
5.0000 10*6.[IU] | Freq: Once | INTRAVENOUS | Status: AC
  Administered 2015-09-18: 5 10*6.[IU] via INTRAVENOUS
  Filled 2015-09-18: qty 5

## 2015-09-18 MED ORDER — CALCIUM CARBONATE ANTACID 500 MG PO CHEW: 2.0000 | CHEWABLE_TABLET | ORAL | Status: DC | PRN

## 2015-09-18 NOTE — H&P (Signed)
Tracy Barr is a 24 y.o. female, G3 P0 at 40.2 weeks  There are no active problems to display for this patient.   Pregnancy Course: Patient entered care at 10.6 weeks.   EDC of 09/16/15 was established by LMP.      [place] evaluations:   19.4 weeks - Anatomy: 12oz - 55%, FHR 141, cervix 3.43,    Posterior placenta, normal cord. RVOT, LVOT, DA not seen  37.5 weeks - FU: EFW 7lb 1oz - 51.8, AFI 14.41, FHR 145, vertex, posterior placenta,   Significant prenatal events:   HSV   Last evaluation:   40.0 weeks   Reason for admission:  Active labor  Pt States:   Contractions Frequency: 4-5         Contraction severity: moderate         Fetal activity:   OB History    Gravida Para Term Preterm AB TAB SAB Ectopic Multiple Living   3 0   Past Medical History  Diagnosis Date  . Chronic back pain   . Infection     UTI  . Herpes    Past Surgical History  Procedure Laterality Date  . No past surgeries     Family History: family history includes Diabetes in her maternal grandfather, paternal grandfather, and paternal grandmother; Heart disease in her maternal grandfather, paternal grandfather, and paternal grandmother; Hypertension in her maternal grandmother, paternal grandfather, and paternal grandmother. There is no history of Hearing loss. Social History:  reports that she has quit smoking. Her smoking use included Cigarettes. She has quit using smokeless tobacco. She reports that she does not drink alcohol or use illicit drugs.   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 rate 18, Tracy if currently breastfeeding.  Maternal Exam:  Uterine Assessment: Contraction frequency is rare.  Abdomen: Gravid, non tender. Fundal height is aga.  Normal external genitalia, vulva, cervix, uterus and adnexa.  No lesions noted on exam.  Pelvis adequate for delivery.  Fetal presentation: Vertex by VE  Fetal Exam:  Monitor Surveillance : Intermitting per orders Mode: Ultrasound.  NICHD: Category 1 CTXs: Q 4-55minutes EFW   7 lbs  Physical Exam: Nursing note and vitals reviewed General: alert and cooperative She appears well nourished Psychiatric: Normal mood and affect. Her behavior is normal Head: Normocephalic Eyes: Pupils are equal, round, and reactive to light Neck: Normal range of motion Cardiovascular: RRR without murmur  Respiratory: CTAB. Effort normal  Abd: soft, non-tender, +BS, no rebound, no guarding  Genitourinary: Vagina normal  Neurological: A&Ox3 Skin: Warm and dry  Musculoskeletal: Normal range of motion  Homan's sign negative bilaterally No evidence of DVTs.  Edema: no edema DTR: 2+ Clonus: None   Prenatal labs: ABO, Rh:  B positive Antibody:  negative Rubella:  immune RPR:   NR HBsAg:   negative HIV:   NR GBS:  positive Sickle cell/Hgb electrophoresis:  WNL Pap:  Negative 02/25/15 GC:   negative  Chlamydia: negative Genetic screenings:  negative Glucola:  wnl  Assessment:  IUP at 40.2 weeks NICHD: Category 1 Membranes: intact Bishop Score: 7 GBS positive  Plan:  Admit to L&D for expectant/active management of labor. Possible augmentation options reviewed including AROM and/or pitocin.   GBS prophylaxis with AMP per Nida Manfredi dosing with onset of advance labor.  IV pain medication per orders PRN Epidural per patient request Foley cath after patient  is comfortable with epidural Anticipate SVD  Labor mgmt as ordered Desires water birth   Okay to ambulate around unit with wireless monitors  Okay to get up and shower without monitoring   May auscultate FHR intermittently,  if expectant management     q 30 min in active labor - x 5 minutes     q 15 min in transition - before during and after a ctx     q 5 min with pushing - before during and after a ctx.     May ambulate without monitoring.     If no active labor, may do NST q 2 hours.   Attending MD available at all times.  Tyner Codner, CNM, MSN 09/18/2015, 8:14 AM

## 2015-09-18 NOTE — Progress Notes (Addendum)
Labor Progress  Subjective: Under total control. Pt laboring in the tub. Starting spontanously pushing about 1800 but the urge stopped around 1820.  Pt was asked to get out of the tub to get a better VE.  Pt remained out the tub for 30 minutes on hands and knees  Objective: BP 116/58 mmHg  Pulse 93  Temp(Src) 98.1 F (36.7 C) (Axillary)  Resp 18  Ht 5\' 4"  (1.626 m)  Wt 188 lb (85.276 kg)  BMI 32.25 kg/m2   Total I/O In: 300 [P.O.:300] Out: -  FHT: 130 CTX:  regular, every 4-5 minutes Uterus gravid, soft non tender SVE:  RIM all the way around /0 +1   Assessment:  IUP at 40.2 weeks NICHD: Category 1 Membranes:  SROM x 3hrs, no s/s of infection Labor progress: adquate labor, transition Asynclitic position  Pitocin Augmentation ZOX:[id number]:positive  Plan: Continue labor plan intermittent monitoring Rest Ambulate pool Frequent position changes to facilitate fetal rotation and descent.      Tracy Barr, CNM, MSN 09/18/2015. 6:57 PM

## 2015-09-18 NOTE — MAU Note (Signed)
Contractions started Friday night, became regular during the night, now every 5-7 min.  No bleeding or leaking.  No problems with preg.  Has not been checked

## 2015-09-18 NOTE — Progress Notes (Signed)
Labor Progress  Subjective: Uncomfortable with each ctx, managing with breathing and family at the bedside  Objective: BP 116/58 mmHg  Pulse 93  Temp(Src) 97.9 F (36.6 C) (Oral)  Resp 18  Ht 5\' 4"  (1.626 m)  Wt 188 lb (85.276 kg)  BMI 32.25 kg/m2     FHT: 130, moderate variability, + accel, no decel CTX:  regular, every 4-6 minutes Uterus gravid, soft non tender SVE:  Dilation: 9 (8-9) Effacement (%): 90 Station: 0 Exam by:: Alyshia Kernan, CNM   Assessment:  IUP at 40.2 weeks NICHD: Category 1 Membranes:  BBW pt decline AROM Labor progress: transition GBS: negative   Plan: Continue labor plan intermittent monitoring Rest Ambulate Labor in the water Frequent position changes to facilitate fetal rotation and descent. Will reassess with cervical exam at 1800 or earlier if necessary    Jacarius Handel, CNM, MSN 09/18/2015. 3:05 PM

## 2015-09-19 ENCOUNTER — Encounter (HOSPITAL_COMMUNITY): Payer: Self-pay

## 2015-09-19 LAB — CBC
HCT: 30.8 % — ABNORMAL LOW (ref 36.0–46.0)
HEMOGLOBIN: 10.8 g/dL — AB (ref 12.0–15.0)
MCH: 32.7 pg (ref 26.0–34.0)
MCHC: 35.1 g/dL (ref 30.0–36.0)
MCV: 93.3 fL (ref 78.0–100.0)
Platelets: 201 10*3/uL (ref 150–400)
RBC: 3.3 MIL/uL — AB (ref 3.87–5.11)
RDW: 13.1 % (ref 11.5–15.5)
WBC: 18.2 10*3/uL — ABNORMAL HIGH (ref 4.0–10.5)

## 2015-09-19 NOTE — Progress Notes (Signed)
Subjective: Postpartum Day 1: Vaginal delivery, right periurethral and left labial laceration Patient up ad lib, reports no syncope or dizziness. Feeding:  Breast Contraceptive plan:  None per pt choice  Objective: Vital signs in last 24 hours: Temp:  [97.9 F (36.6 C)-98.6 F (37 C)] 98.1 F (36.7 C) (11/14 0400) Pulse Rate:  [80-134] 82 (11/14 0400) Resp:  [16-20] 16 (11/14 0400) BP: (96-161)/(52-96) 96/52 mmHg (11/14 0400) SpO2:  [89 %-98 %] 98 % (11/14 0400)  Physical Exam:  General: alert and cooperative Lochia: appropriate Uterine Fundus: firm Perineum: healing well per pt report DVT Evaluation: No evidence of DVT seen on physical exam.   CBC Latest Ref Rng 09/19/2015 09/18/2015 07/22/2014  WBC 4.0 - 10.5 K/uL 18.2(H) 16.8(H) 5.6  Hemoglobin 12.0 - 15.0 g/dL 10.8(L) 12.1 12.4  Hematocrit 36.0 - 46.0 % 30.8(L) 34.7(L) 36.4  Platelets 150 - 400 K/uL 201 201 196     Assessment/Plan: Status post vaginal delivery day 1. Stable  Continue current care. Plan for discharge tomorrow, Breastfeeding and Lactation consult    Beatrix FettersRachel StallCNM 09/19/2015, 9:06 AM

## 2015-09-19 NOTE — Lactation Note (Signed)
 GGrisell Memorial Hospitalee AJake [phone] Karie SchwalbeKe64ntu lAdvanced Micro [phone] Clay 66mKarie Schwalbe oreaSampson [phone]Jovita GammaAlver Fisher    [id number] 44Cal[facility] Health Medical CenterJake BatheGlee ArvinAnette RiedelAdvanced Micro [phone] Clay 73mKarie Schwalbe KoreaSampson [phone] Ridge LaneJovita GammaAlver Fisher    [id number] 78[facility] Meyers[facility] Medical CenterJake BatheGlee ArvinAnette RiedelAdvanced Micro [phone] Clay 73mKarie Schwalbe KoreaSampson [phone] StreetJovita GammaAlver Fisher    [id number] 55[facility] North GarlandJake BatheGlee ArvinAnette RiedelAdvanced Micro [phone] Clay 88m

## 2015-09-20 MED ORDER — IBUPROFEN 600 MG PO TABS: 600.0000 mg | ORAL_TABLET | Freq: Four times a day (QID) | ORAL | Status: DC | PRN

## 2015-09-20 NOTE — Discharge Summary (Signed)
OB Discharge Summary   Patient Name: Tracy Barr DOB: 05/28/1991 MRN: [id number]  Date of admission: 09/18/2015 Delivering MD: Gerrit HeckEMLY, JESSICA   Date of discharge: 09/20/2015  Admitting diagnosis: 40wks, CTX Intrauterine pregnancy: 5217w2d  Secondary diagnosis: Principal Problem:  SVD (spontaneous vaginal delivery) Active Problems:  Normal labor  Preterm labor  Group beta Strep positive  Additional problems: none  Discharge diagnosis: Term Pregnancy Delivered    Post partum procedures:none  Augmentation: none  Complications: None  Hospital course: Onset of Labor With Vaginal Delivery 24 y.o. yo [id number] at 4517w2d was admitted in Active Laboron 09/18/2015. Patient had an uncomplicated labor course as follows:  Membrane Rupture Time/Date: 3:58 PM ,09/18/2015   Intrapartum Procedures: Episiotomy: None [1]   Lacerations: Labial [10];Periurethral [8]  Patient had a delivery of a Viable infant. 09/18/2015  Information for the patient's newborn:  Mia CreekLocklear, Girl Erie NoeVanessa [[id number]][[id number]]  Delivery Method: Vaginal, Spontaneous Delivery (Filed from Delivery Summary)    Pateint had an uncomplicated postpartum course. She is ambulating, tolerating a regular diet, passing flatus, and urinating well. Patient is discharged home in stable condition on No discharge date for patient encounter.Marland Kitchen.    Physical exam  Filed Vitals:   09/19/15 0000 09/19/15 0400 09/19/15 1014 09/20/15 0602  BP: 99/58 96/52 108/53 97/50  Pulse: 100 82 83 74  Temp: 98.6 F (37 C) 98.1 F (36.7 C) 98 F (36.7 C) 98.4 F (36.9 C)  TempSrc: Oral Oral  Oral  Resp: 16 16 18 18   Height:      Weight:      SpO2: 98% 98%  100%   General: alert and  cooperative Lochia: appropriate Uterine Fundus: firm Incision: none Laceration: healing well  DVT Evaluation: No evidence of DVT seen on physical exam. Labs:  Recent Labs    Lab Results  Component Value Date   WBC 18.2* 09/19/2015   HGB 10.8* 09/19/2015   HCT 30.8* 09/19/2015   MCV 93.3 09/19/2015   PLT 201 09/19/2015     CMP Latest Ref Rng 05/24/2009  Glucose 70 - 99 mg/dL 130(Q115(H)  BUN 6 - 23 mg/dL 4(L)  Creatinine 0.4 - 1.2 mg/dL 6.570.46  Sodium 846135 - 962145 mEq/L 141  Potassium 3.5 - 5.1 mEq/L 3.5  Chloride 96 - 112 mEq/L 105  CO2 19 - 32 mEq/L 27  Calcium 8.4 - 10.5 mg/dL 8.5  Total Protein 6.0 - 8.3 g/dL 9.5(M5.6(L)  Total Bilirubin 0.3 - 1.2 mg/dL 0.7  Alkaline Phos 47 - 119 U/L 152(H)  AST 0 - 37 U/L 255(H)  ALT 0 - 35 U/L 383(H)    Discharge instruction: per After Visit Summary and "Baby and Me Booklet".  After visit meds:    Medication List    TAKE these medications       ibuprofen 600 MG tablet  Commonly known as: ADVIL,MOTRIN  Take 1 tablet (600 mg total) by mouth every 6 (six) hours as needed.     prenatal multivitamin Tabs tablet  Take 1 tablet by mouth daily at 12 noon.     valACYclovir 500 MG tablet  Commonly known as: VALTREX  Take 500 mg by mouth 2 (two) times daily.        Diet: routine diet  Activity: Advance as tolerated. Pelvic rest for 6 weeks.   Outpatient follow up:6 weeks Follow up Appt:No future appointments. Follow up Visit:No Follow-up on file.  Postpartum contraception: None  Newborn Data: Live born female  Birth Weight: 7 lb 4.4 oz (3300 g)  APGAR: 8, 9  Baby Feeding: Breast Disposition:home with mother   09/20/2015 Alphonzo Severance, CNM

## 2015-09-20 NOTE — Lactation Note (Signed)
This note was copied from the chart of Tracy Barr. Lactation Consultation Note  Patient Name: Tracy Babette RelicVanessa [place] HKVQQ'VToday's Date: 09/20/2015 Reason for consult: Follow-up assessment;Difficult latch   Folllow up with mom with 36 hour old infant. Infant with 7 BF attempts, 5 syringe feedings of 1,4,4,4, and 10 cc, 3 stools and 0 voids in last 24 hours. Infant with 5% weight loss. Mom has DEBP at bedside, she did not pump during the night she says because it is not getting anything. She has been hand expressing and getting 4-10 cc colostrum. She has a Colostrum collection container full of Colostrum at bedside that she expressed at 7 am, encouraged her to give all milk obtained to infant as infant has not voided in last 24 hours. Mom reports she has not latched infant through the night and has not used the nipple shield. Infant currently asleep sts with mom. Mom reports she has been syringe feeding infant, discussed that we will need to make a long term plan for feeding. Discussed supply and demand and need to be stimulating breast with infant and pump to initiate and maintain a milk supply. Discussed need for pump at D/C. Mom is a Medicaid client, and [facility], discussed with mom that [facility] supply is currently low and pump may not be readily available. Parents said they would go and buy a pump. Discussed best pumps that would fit their current situation. Discussed 2 week pump rental for parents to decide if they wish to apply [facility] or purchase a pump. Mom said that she would like to try infant at breast today using a NS, gave phone # and encouraged mom to call for next feeding for assistance. Will need follow up today.   Maternal Data Formula Feeding for Exclusion: No Has patient been taught Hand Expression?: Yes Does the patient have breastfeeding experience prior to this delivery?: No  Feeding    LATCH Score/Interventions                      Lactation  Tools Discussed/Used WIC Program: No (Is on Medicaid, [facility])   Consult Status Consult Status: Follow-up Date: 09/20/15 Follow-up type: In-patient    Silas FloodSharon S Karlin Binion 09/20/2015, 8:51 AM

## 2015-09-20 NOTE — Discharge Summary (Signed)
OB Discharge Summary     Patient Name: Tracy Barr DOB: 1991/03/22 MRN: [id number]  Date of admission: 09/18/2015 Delivering MD: Gerrit Heck   Date of discharge: 09/20/2015  Admitting diagnosis: 40wks, CTX Intrauterine pregnancy: [redacted]w[redacted]d     Secondary diagnosis:  Principal Problem:   SVD (spontaneous vaginal delivery) Active Problems:   Normal labor   Preterm labor   Group beta Strep positive  Additional problems: none     Discharge diagnosis: Term Pregnancy Delivered                                                                                                Post partum procedures:none  Augmentation: none  Complications: None  Hospital course:  Onset of Labor With Vaginal Delivery     24 y.o. yo [id number] at [redacted]w[redacted]d was admitted in Active Laboron 09/18/2015. Patient had an uncomplicated labor course as follows:  Membrane Rupture Time/Date: 3:58 PM ,09/18/2015   Intrapartum Procedures: Episiotomy: None [1]                                         Lacerations:  Labial [10];Periurethral [8]  Patient had a delivery of a Viable infant. 09/18/2015  Information for the patient's newborn:  Nyiah, Pianka Girl Shanen [[id number]]  Delivery Method: Vaginal, Spontaneous Delivery (Filed from Delivery Summary)    Pateint had an uncomplicated postpartum course.  She is ambulating, tolerating a regular diet, passing flatus, and urinating well. Patient is discharged home in stable condition on No discharge date for patient encounter.Marland Kitchen    Physical exam  Filed Vitals:   09/19/15 0000 09/19/15 0400 09/19/15 1014 09/20/15 0602  BP: 99/58 96/52 108/53 97/50  Pulse: 100 82 83 74  Temp: 98.6 F (37 C) 98.1 F (36.7 C) 98 F (36.7 C) 98.4 F (36.9 C)  TempSrc: Oral Oral  Oral  Resp: Height:      Weight:      SpO2: 98% 98%  100%   General: alert and cooperative Lochia: appropriate Uterine Fundus: firm Incision: none  Laceration: healing well  DVT Evaluation: No  evidence of DVT seen on physical exam. Labs: Lab Results  Component Value Date   WBC 18.2* 09/19/2015   HGB 10.8* 09/19/2015   HCT 30.8* 09/19/2015   MCV 93.3 09/19/2015   PLT 201 09/19/2015   CMP Latest Ref Rng 05/24/2009  Glucose 70 - 99 mg/dL 130(Q)  BUN 6 - 23 mg/dL 4(L)  Creatinine 0.4 - 1.2 mg/dL 6.57  Sodium 846 - 962 mEq/L 141  Potassium 3.5 - 5.1 mEq/L 3.5  Chloride 96 - 112 mEq/L 105  CO2 19 - 32 mEq/L 27  Calcium 8.4 - 10.5 mg/dL 8.5  Total Protein 6.0 - 8.3 g/dL 9.5(M)  Total Bilirubin 0.3 - 1.2 mg/dL 0.7  Alkaline Phos 47 - 119 U/L 152(H)  AST 0 - 37 U/L 255(H)  ALT 0 - 35 U/L 383(H)    Discharge instruction: per After Visit Summary and "Baby  and Me Booklet".  After visit meds:    Medication List    TAKE these medications        ibuprofen 600 MG tablet  Commonly known as:  ADVIL,MOTRIN  Take 1 tablet (600 mg total) by mouth every 6 (six) hours as needed.     prenatal multivitamin Tabs tablet  Take 1 tablet by mouth daily at 12 noon.     valACYclovir 500 MG tablet  Commonly known as:  VALTREX  Take 500 mg by mouth 2 (two) times daily.        Diet: routine diet  Activity: Advance as tolerated. Pelvic rest for 6 weeks.   Outpatient follow up:6 weeks Follow up Appt:No future appointments. Follow up Visit:No Follow-up on file.  Postpartum contraception: None  Newborn Data: Live born female  Birth Weight: 7 lb 4.4 oz (3300 g) APGAR: 8, 9  Baby Feeding: Breast Disposition:home with mother   09/20/2015 Alphonzo Severanceachel Kishawn Pickar, CNM

## 2015-09-20 NOTE — Lactation Note (Signed)
This note was copied from the chart of Tracy Barr Tingley. Lactation Consultation Note  Patient Name: Tracy Barr Hunsberger [id number]'s Date: 09/20/2015 Reason for consult: Follow-up assessment;Difficult latch   Called to room for feeding assessment. Infant was awake and rooting. Mom placed #20 NS independently, Nipple did not fit well into NS. Placed #24 NS. Infant would not open mouth to take nipple into mouth. When had infant suck on finger, infant with clamped gums. She did not extend tongue over gumline when she did have a few sucks, she did not get into a rhythmic sucking pattern. Infant was gaggy with repetitive swallowing noted with gagging. Mom with large pendulous breasts and flat nipples that do evert when stimulated. Colostrum easily expressed. Advised mom to pump every 2-3 hours for 15 minutes on preemie setting followed by hand expression. Discussed need to get calories into infant at least every 3 hours and that if infant may need supplementation if mom not able to pump/hand express enough EBM. Infant does not have a wet diaper at beginning of feeding, her mucous membranes are moist. Will need to follow up.     Maternal Data Formula Feeding for Exclusion: No Has patient been taught Hand Expression?: Yes Does the patient have breastfeeding experience prior to this delivery?: No  Feeding Feeding Type: Breast Fed  LATCH Score/Interventions Latch: Too sleepy or reluctant, no latch achieved, no sucking elicited. Intervention(s): Skin to skin;Teach feeding cues;Waking techniques  Audible Swallowing: None Intervention(s): Skin to skin  Type of Nipple: Flat (Will evert with stimulation) Intervention(s): Shells  Comfort (Breast/Nipple): Soft / non-tender     Hold (Positioning): Assistance needed to correctly position infant at breast and maintain latch. Intervention(s): Breastfeeding basics reviewed;Support Pillows;Position options;Skin to skin  LATCH Score: 4  Lactation  Tools Discussed/Used Tools: Nipple Shields Nipple shield size: 20;24 [facility]IC Program: No ([facility] on Medicaid[facility] Pump Review: Setup, frequency, and cleaning   Consult Status Consult Status: Follow-up Date: 09/20/15 Follow-up type: In-patient    Silas FloodSharon S Shauntell Iglesia 09/20/2015, 10:01 AM

## 2015-09-20 NOTE — Discharge Instructions (Signed)
Postpartum Care After Vaginal Delivery After you deliver your newborn (postpartum period), the usual stay in the hospital is 24-72 hours. If there were problems with your labor or delivery, or if you have other medical problems, you might be in the hospital longer.  While you are in the hospital, you will receive help and instructions on how to care for yourself and your newborn during the postpartum period.  While you are in the hospital:  Be sure to tell your nurses if you have pain or discomfort, as well as where you feel the pain and what makes the pain worse.  If you had an incision made near your vagina (episiotomy) or if you had some tearing during delivery, the nurses may put ice packs on your episiotomy or tear. The ice packs may help to reduce the pain and swelling.  If you are breastfeeding, you may feel uncomfortable contractions of your uterus for a couple of weeks. This is normal. The contractions help your uterus get back to normal size.  It is normal to have some bleeding after delivery.  Iron-Rich Diet Iron is a mineral that helps your body to produce hemoglobin. Hemoglobin is a protein in your red blood cells that carries oxygen to your body's tissues. Eating too little iron may cause you to feel weak and tired, and it can increase your risk for infection. Eating enough iron is necessary for your body's metabolism, muscle function, and nervous system. Iron is naturally found in many foods. It can also be added to foods or fortified in foods. There are two types of dietary iron:  Heme iron. Heme iron is absorbed by the body more easily than nonheme iron. Heme iron is found in meat, poultry, and fish.  Nonheme iron. Nonheme iron is found in dietary supplements, iron-fortified grains, beans, and vegetables. You may need to follow an iron-rich diet if:  You have been diagnosed with iron deficiency or iron-deficiency anemia.  You have a condition that prevents you from absorbing  dietary iron, such as:  Infection in your intestines.  Celiac disease. This involves long-lasting (chronic) inflammation of your intestines.  You do not eat enough iron.  You eat a diet that is high in foods that impair iron absorption.  You have lost a lot of blood.  You have heavy bleeding during your menstrual cycle.  You are pregnant. WHAT IS MY PLAN? Your health care provider may help you to determine how much iron you need per day based on your condition. Generally, when a person consumes sufficient amounts of iron in the diet, the following iron needs are met:  Men.  55-39 years old: 11 mg per day.  17-31 years old: 8 mg per day.  Women.   76-66 years old: 15 mg per day.  17-15 years old: 18 mg per day.  Over 57 years old: 8 mg per day.  Pregnant women: 27 mg per day.  Breastfeeding women: 9 mg per day. WHAT DO I NEED TO KNOW ABOUT AN IRON-RICH DIET?  Eat fresh fruits and vegetables that are high in vitamin C along with foods that are high in iron. This will help increase the amount of iron that your body absorbs from food, especially with foods containing nonheme iron. Foods that are high in vitamin C include oranges, peppers, tomatoes, and mango.  Take iron supplements only as directed by your health care provider. Overdose of iron can be life-threatening. If you were prescribed iron supplements, take them with orange  juice or a vitamin C supplement.  Cook foods in pots and pans that are made from iron.   Eat nonheme iron-containing foods alongside foods that are high in heme iron. This helps to improve your iron absorption.   Certain foods and drinks contain compounds that impair iron absorption. Avoid eating these foods in the same meal as iron-rich foods or with iron supplements. These include:  Coffee, black tea, and red wine.  Milk, dairy products, and foods that are high in calcium.  Beans, soybeans, and peas.  Whole grains.  When eating foods  that contain both nonheme iron and compounds that impair iron absorption, follow these tips to absorb iron better.   Soak beans overnight before cooking.  Soak whole grains overnight and drain them before using.  Ferment flours before baking, such as using yeast in bread dough. WHAT FOODS CAN I EAT? Grains Iron-fortified breakfast cereal. Iron-fortified whole-wheat bread. Enriched rice. Sprouted grains. Vegetables Spinach. Potatoes with skin. Green peas. Broccoli. Red and green bell peppers. Fermented vegetables. Fruits Prunes. Raisins. Oranges. Strawberries. Mango. Grapefruit. Meats and Other Protein Sources Beef liver. Oysters. Beef. Shrimp. [place]. Chicken. [place]. Sardines. Chickpeas. Nuts. Tofu. Beverages Tomato juice. Fresh orange juice. Prune juice. Hibiscus tea. Fortified instant breakfast shakes. Condiments Tahini. Fermented soy sauce. Sweets and Desserts Black-strap molasses.  Other Wheat germ. The items listed above may not be a complete list of recommended foods or beverages. Contact your dietitian for more options. WHAT FOODS ARE NOT RECOMMENDED? Grains Whole grains. Bran cereal. Bran flour. Oats. Vegetables Artichokes. Brussels sprouts. Kale. Fruits Blueberries. Raspberries. Strawberries. Figs. Meats and Other Protein Sources Soybeans. Products made from soy protein. Dairy Milk. Cream. Cheese. Yogurt. Cottage cheese. Beverages Coffee. Black tea. Red wine. Sweets and Desserts Cocoa. Chocolate. Ice cream. Other Basil. Oregano. Parsley. The items listed above may not be a complete list of foods and beverages to avoid. Contact your dietitian for more information.   This information is not intended to replace advice given to you by your health care provider. Make sure you discuss any questions you have with your health care provider.   Document Released: 06/05/2005 Document Revised: 11/12/2014 Document Reviewed: 05/19/2014 Elsevier Interactive  Patient Education [employer].   For the first 1-3 days after delivery, the flow is red and the amount may be similar to a period.  It is common for the flow to start and stop.  In the first few days, you may pass some small clots. Let your nurses know if you begin to pass large clots or your flow increases.  Do not  flush blood clots down the toilet before having the nurse look at them.  During the next 3-10 days after delivery, your flow should become more watery and pink or brown-tinged in color.  Ten to fourteen days after delivery, your flow should be a small amount of yellowish-white discharge.  The amount of your flow will decrease over the first few weeks after delivery. Your flow may stop in 6-8 weeks. Most women have had their flow stop by 12 weeks after delivery.  You should change your sanitary pads frequently.  Wash your hands thoroughly with soap and water for at least 20 seconds after changing pads, using the toilet, or before holding or feeding your newborn.  You should feel like you need to empty your bladder within the first 6-8 hours after delivery.  In case you become weak, lightheaded, or faint, call your nurse before you get out of bed for  the first time and before you take a shower for the first time.  Within the first few days after delivery, your breasts may begin to feel tender and full. This is called engorgement. Breast tenderness usually goes away within 48-72 hours after engorgement occurs. You may also notice milk leaking from your breasts. If you are not breastfeeding, do not stimulate your breasts. Breast stimulation can make your breasts produce more milk.  Spending as much time as possible with your newborn is very important. During this time, you and your newborn can feel close and get to know each other. Having your newborn stay in your room (rooming in) will help to strengthen the bond with your newborn. It will give you time to get to know  your newborn and become comfortable caring for your newborn.  Your hormones change after delivery. Sometimes the hormone changes can temporarily cause you to feel sad or tearful. These feelings should not last more than a few days. If these feelings last longer than that, you should talk to your caregiver.  If desired, talk to your caregiver about methods of family planning or contraception.  Talk to your caregiver about immunizations. Your caregiver may want you to have the following immunizations before leaving the hospital:  Tetanus, diphtheria, and pertussis (Tdap) or tetanus and diphtheria (Td) immunization. It is very important that you and your family (including grandparents) or others caring for your newborn are up-to-date with the Tdap or Td immunizations. The Tdap or Td immunization can help protect your newborn from getting ill.  Rubella immunization.  Varicella (chickenpox) immunization.  Influenza immunization. You should receive this annual immunization if you did not receive the immunization during your pregnancy.   This information is not intended to replace advice given to you by your health care provider. Make sure you discuss any questions you have with your health care provider.   Document Released: 08/19/2007 Document Revised: 07/16/2012 Document Reviewed: 06/18/2012 Elsevier Interactive Patient Education [employer].

## 2015-09-21 ENCOUNTER — Ambulatory Visit: Payer: Self-pay

## 2015-09-21 ENCOUNTER — Encounter (HOSPITAL_COMMUNITY)
Admission: RE | Admit: 2015-09-21 | Discharge: 2015-09-21 | Disposition: A | Discharge status: Home / Self Care | Payer: Medicaid Other | Source: Ambulatory Visit | Attending: Obstetrics & Gynecology | Admitting: Obstetrics & Gynecology

## 2015-09-21 NOTE — Lactation Note (Signed)
This note was copied from the chart of Tracy Barr. Lactation Consultation Note  Patient Name: Tracy Barr Date: 09/21/2015 Reason for consult: Follow-up assessment 69 hour old, 9% weight loss, 6-9.8 oz ,  Breast feeding range - 10 -45 mins. Latch score range -4 - 8 -4-7-9 Void s2 , ( one out of the 2 was large wet )  8 stools  Bili check at 51 hours- 1.2  Supplemented with EBM with a curved tip syringe after breast feeding 1-10 ml x 9 . Per mom breast are getting heavier and recently pumped off 20 ml.  LC discussed weight loss of 5 % in the last 24 hours , and at 9 % today , therefore what after volume  Pumped off or hand expressed finger feed back to the baby until her next weight check with Pedis. Sore nipple and engorgement prevention and tx reviewed.  Mom plans to rent a DEBP from  [facility] today, ( already has the DEBP kit ).  Also per mom isn't signed up for [facility] , but has Medicaid . LC recommended calling WIC for a appt.  Mother informed of post-discharge support and given phone number to the lactation department, including services for phone call assistance; out-patient appointments; and breastfeeding support group. List of other breastfeeding resources in the community given in the handout. Encouraged mother to call for problems or concerns related to breastfeeding.  Maternal Data    Feeding Feeding Type: Breast Fed Length of feed: 10 min  LATCH Score/Interventions Latch: Grasps breast easily, tongue down, lips flanged, rhythmical sucking.  Audible Swallowing: A few with stimulation  Type of Nipple: Everted at rest and after stimulation  Comfort (Breast/Nipple): Soft / non-tender     Hold (Positioning): No assistance needed to correctly position infant at breast. Intervention(s): Breastfeeding basics reviewed  LATCH Score: 9  Lactation Tools Discussed/Used     Consult Status Consult Status: Follow-up Date: 09/21/15    Myer Haff 09/21/2015, 11:42 AM

## 2017-11-05 NOTE — L&D Delivery Note (Signed)
Tracy Barr is a 27 y.o. [id number] at [id number] who presented in active labor. She progressed normally without augmentation to SROM and then C/C and pushing.    Delivery Note At 4:11 PM a viable female was delivered via Vaginal, Spontaneous (Presentation:LOA  ).  APGAR: 8, 9; weight pending .   Placenta status: complete,intact .  Cord: 3V with the following complications: none .  Cord pH: NA  Anesthesia: local  Episiotomy: None Lacerations: 2nd degree;Perineal Suture Repair: 3.0 monocryl  Est. Blood Loss (mL):  200 cc  Mom to postpartum.  Baby to Couplet care / Skin to Skin.  Thressa ShellerHeather Zephaniah Enyeart 05/14/2018, 4:47 PM   Sam Reita ClicheWeinhold, CNM was present for the delivery as the delivering provider and I was there to proctor this waterbirth.

## 2018-02-20 ENCOUNTER — Encounter: Payer: Self-pay | Admitting: Certified Nurse Midwife

## 2018-02-20 ENCOUNTER — Ambulatory Visit (INDEPENDENT_AMBULATORY_CARE_PROVIDER_SITE_OTHER): Payer: Medicaid Other | Admitting: Certified Nurse Midwife

## 2018-02-20 ENCOUNTER — Other Ambulatory Visit (HOSPITAL_COMMUNITY)
Admission: RE | Admit: 2018-02-20 | Discharge: 2018-02-20 | Disposition: A | Discharge status: Home / Self Care | Payer: Medicaid Other | Source: Ambulatory Visit | Attending: Certified Nurse Midwife | Admitting: Certified Nurse Midwife

## 2018-02-20 VITALS — BP 103/70 | HR 90 | Wt 176.2 lb

## 2018-02-20 DIAGNOSIS — Z3480 Encounter for supervision of other normal pregnancy, unspecified trimester: Secondary | ICD-10-CM

## 2018-02-20 DIAGNOSIS — Z3482 Encounter for supervision of other normal pregnancy, second trimester: Secondary | ICD-10-CM

## 2018-02-20 DIAGNOSIS — O0933 Supervision of pregnancy with insufficient antenatal care, third trimester: Secondary | ICD-10-CM

## 2018-02-20 DIAGNOSIS — O0932 Supervision of pregnancy with insufficient antenatal care, second trimester: Secondary | ICD-10-CM

## 2018-02-20 MED ORDER — OB COMPLETE PETITE 35-5-1-200 MG PO CAPS
1.0000 | ORAL_CAPSULE | Freq: Every day | ORAL | 12 refills | Status: DC
Start: 2018-02-20 — End: 2018-04-03

## 2018-02-20 NOTE — Progress Notes (Signed)
NOB  No recent imaging studies during this pregnancy.  HSV +   Declines Genetic Screening Pt has no concerns today.

## 2018-02-20 NOTE — Progress Notes (Signed)
Subjective:   Tracy Barr is a 27 y.o. [id number] at 2461w4d by LMP being seen today for her first obstetrical visit.  Her obstetrical history is significant for late prenatal care. Patient does intend to breast feed. Pregnancy history fully reviewed.  Patient reports no complaints.  HISTORY: OB History  Gravida Para Term Preterm AB Living  4 1 1  0 2 1  SAB TAB Ectopic Multiple Live Births  1 1 0 0 1    # Outcome Date GA Lbr Len/2nd Weight Sex Delivery Anes PTL Lv  4 Current           3 Term 09/18/15 167w2d 16:52 / 03:39 7 lb 4.4 oz (3.3 kg) F Vag-Spont Local  LIV     Name: Belflower,GIRL Tucker     Apgar1: 8  Apgar5: 9  2 SAB           1 TAB             Last pap smear was done unknown, normal according to the patient.  Past Medical History:  Diagnosis Date  . Chronic back pain   . Herpes   . Infection    UTI   Past Surgical History:  Procedure Laterality Date  . NO PAST SURGERIES     Family History  Problem Relation Age of Onset  . Hypertension Maternal Grandmother   . Diabetes Maternal Grandfather   . Heart disease Maternal Grandfather   . Diabetes Paternal Grandmother   . Hypertension Paternal Grandmother   . Heart disease Paternal Grandmother   . Diabetes Paternal Grandfather   . Hypertension Paternal Grandfather   . Heart disease Paternal Grandfather   . Hearing loss Neg Hx    Social History   Tobacco Use  . Smoking status: Former Smoker    Types: Cigarettes  . Smokeless tobacco: Former NeurosurgeonUser  . Tobacco comment: 2012, use Hookah  Substance Use Topics  . Alcohol use: No  . Drug use: No   No Known Allergies Current Outpatient Medications on File Prior to Visit  Medication Sig Dispense Refill  . ibuprofen (ADVIL,MOTRIN) 600 MG tablet Take 1 tablet (600 mg total) by mouth every 6 (six) hours as needed. (Patient not taking: Reported on 02/20/2018) 30 tablet 2  . Prenatal Vit-Fe Fumarate-FA (PRENATAL MULTIVITAMIN) TABS tablet Take 1 tablet by mouth  daily at 12 noon.    . valACYclovir (VALTREX) 500 MG tablet Take 500 mg by mouth 2 (two) times daily.     No current facility-administered medications on file prior to visit.     Review of Systems Pertinent items noted in HPI and remainder of comprehensive ROS otherwise negative.  Exam   Vitals:   02/20/18 1415  BP: 103/70  Pulse: 90  Weight: 176 lb 3.2 oz (79.9 kg)   Fetal Heart Rate (bpm): 147; doppler  Uterus:  Fundal Height: 27 cm  Pelvic Exam: Perineum: no hemorrhoids, normal perineum   Vulva: normal external genitalia, no lesions   Vagina:  normal mucosa, normal discharge   Cervix: no lesions and normal, pap smear done.    Adnexa: normal adnexa and no mass, fullness, tenderness   Bony Pelvis: average  System: General: well-developed, well-nourished female in no acute distress   Breast:  normal appearance, no masses or tenderness   Skin: normal coloration and turgor, no rashes   Neurologic: oriented, normal, negative, normal mood   Extremities: normal strength, tone, and muscle mass, ROM of all joints is normal  HEENT PERRLA, extraocular movement intact and sclera clear, anicteric   Mouth/Teeth mucous membranes moist, pharynx normal without lesions and dental hygiene good   Neck supple and no masses   Cardiovascular: regular rate and rhythm   Respiratory:  no respiratory distress, normal breath sounds   Abdomen: soft, non-tender; bowel sounds normal; no masses,  no organomegaly     Assessment:   Pregnancy: [id number] Patient Active Problem List   Diagnosis Date Noted  . Supervision of other normal pregnancy, antepartum 02/20/2018  . Late prenatal care affecting pregnancy in third trimester 02/20/2018     Plan:  1. Supervision of other normal pregnancy, antepartum      Desires waterbirth, has her own tub, has attended the class in the past.   - Obstetric Panel, Including HIV - Culture, OB Urine - Cytology - PAP - Hemoglobinopathy evaluation - Cervicovaginal  ancillary only - [place] MFM OB COMP + 14 WK; Future - Vitamin D (25 hydroxy) - Hemoglobin A1c - Inheritest Core(CF97,SMA,FraX) - Prenat-FeCbn-FeAspGl-FA-Omega (OB COMPLETE PETITE) 35-5-1-200 MG CAPS; Take 1 tablet by mouth daily.  Dispense: 30 capsule; Refill: 12  2. Late prenatal care affecting pregnancy in third trimester        Initial labs drawn. Continue prenatal vitamins. Genetic Screening discussed, NIPS: declined. Ultrasound discussed; fetal anatomic survey: ordered. Problem list reviewed and updated. The nature of [facility] with multiple MDs and other Advanced Practice Providers was explained to patient; also emphasized that residents, students are part of our team. Routine obstetric precautions reviewed. Return in about 2 weeks (around 03/06/2018) for ROB, 2 hr OGTT ASAP.     Orvilla Cornwall, CNM [facility]

## 2018-02-20 NOTE — Patient Instructions (Signed)
Considering Waterbirth? Guide for patients at [facility]  Why consider waterbirth?  . Gentle birth for babies . Less pain medicine used in labor . May allow for passive descent/less pushing . May reduce perineal tears  . More mobility and instinctive maternal position changes . Increased maternal relaxation . Reduced blood pressure in labor  Is waterbirth safe? What are the risks of infection, drowning or other complications?  . Infection: o Very low risk (3.7 % for tub vs 4.8% for bed) o 7 in 8000 waterbirths with documented infection o Poorly cleaned equipment most common cause o Slightly lower group B strep transmission rate  . Drowning o Maternal:  - Very low risk   - Related to seizures or fainting o Newborn:  - Very low risk. No evidence of increased risk of respiratory problems in multiple large studies - Physiological protection from breathing under water - Avoid underwater birth if there are any fetal complications - Once baby's head is out of the water, keep it out.  . Birth complication o Some reports of cord trauma, but risk decreased by bringing baby to surface gradually o No evidence of increased risk of shoulder dystocia. Mothers can usually change positions faster in water than in a bed, possibly aiding the maneuvers to free the shoulder.   You must attend a Doren Custard class at [facility]  3rd Wednesday of every month from 7-9pm  [employer] by calling [phone] or online at [email or web address]  Bring [place] the certificate from the class to your prenatal appointment  Meet with a midwife at 36 weeks to see if you can still plan a waterbirth and to sign the consent.   Purchase or rent the following supplies:   Water Birth Pool (Birth Pool in a Box or [place] for instance)  (Tubs start ~$125)  Single-use disposable tub liner designed for your brand of tub  New garden hose labeled "lead-free", "suitable for drinking  water",  Electric drain pump to remove water (We recommend 792 gallon per hour or greater pump.)   Separate garden hose to remove the dirty water  Fish net  Bathing suit top (optional)  Long-handled mirror (optional)  Places to purchase or rent supplies  GotWeb[employer].is for tub purchases and supplies  Waterbirthsolutions.com for tub purchases and supplies  The [employer] Ladies (www.thelaborladies.com) $275 for tub rental/set-up & take down/kit   [employer] (http://www.fleming.com/.htm) Information regarding doulas (labor support) who provide pool rentals  Our practice has a Birth Pool in a Box tub at the hospital that you may borrow on a first-come-first-served basis. It is your responsibility to to set up, clean and break down the tub. We cannot guarantee the availability of this tub in advance. You are responsible for bringing all accessories listed above. If you do not have all necessary supplies you cannot have a waterbirth.    Things that would prevent you from having a waterbirth:  Premature, <37wks  Previous cesarean birth  Presence of thick meconium-stained fluid  Multiple gestation (Twins, triplets, etc.)  Uncontrolled diabetes or gestational diabetes requiring medication  Hypertension requiring medication or diagnosis of pre-eclampsia  Heavy vaginal bleeding  Non-reassuring fetal heart rate  Active infection (MRSA, etc.). Group B Strep is NOT a contraindication for  waterbirth.  If your labor has to be induced and induction method requires continuous  monitoring of the baby's heart rate  Other risks/issues identified by your obstetrical provider  Please remember that birth is unpredictable. Under certain unforeseeable  circumstances your provider may advise against giving birth in the tub. These decisions will be made on a case-by-case basis and with the safety of you and your baby as our highest priority.

## 2018-02-21 LAB — CERVICOVAGINAL ANCILLARY ONLY
Bacterial vaginitis: POSITIVE — AB
CANDIDA VAGINITIS: POSITIVE — AB
CHLAMYDIA, DNA PROBE: NEGATIVE
Neisseria Gonorrhea: NEGATIVE
Trichomonas: NEGATIVE

## 2018-02-22 LAB — OBSTETRIC PANEL, INCLUDING HIV
Antibody Screen: NEGATIVE
BASOS ABS: 0 10*3/uL (ref 0.0–0.2)
Basos: 0 %
EOS (ABSOLUTE): 0.1 10*3/uL (ref 0.0–0.4)
Eos: 1 %
HIV Screen 4th Generation wRfx: NONREACTIVE
Hematocrit: 31 % — ABNORMAL LOW (ref 34.0–46.6)
Hemoglobin: 11 g/dL — ABNORMAL LOW (ref 11.1–15.9)
Hepatitis B Surface Ag: NEGATIVE
IMMATURE GRANS (ABS): 0.1 10*3/uL (ref 0.0–0.1)
IMMATURE GRANULOCYTES: 1 %
Lymphocytes Absolute: 1.7 10*3/uL (ref 0.7–3.1)
Lymphs: 19 %
MCH: 32.8 pg (ref 26.6–33.0)
MCHC: 35.5 g/dL (ref 31.5–35.7)
MCV: 93 fL (ref 79–97)
MONOCYTES: 6 %
MONOS ABS: 0.5 10*3/uL (ref 0.1–0.9)
NEUTROS PCT: 73 %
Neutrophils Absolute: 6.2 10*3/uL (ref 1.4–7.0)
Platelets: 240 10*3/uL (ref 150–379)
RBC: 3.35 x10E6/uL — AB (ref 3.77–5.28)
RDW: 12.5 % (ref 12.3–15.4)
RPR Ser Ql: NONREACTIVE
Rh Factor: POSITIVE
Rubella Antibodies, IGG: 2.12 index (ref >0.99)
WBC: 8.6 10*3/uL (ref 3.4–10.8)

## 2018-02-22 LAB — VITAMIN D 25 HYDROXY (VIT D DEFICIENCY, FRACTURES): Vit D, 25-Hydroxy: 18.2 ng/mL — ABNORMAL LOW (ref 30.0–100.0)

## 2018-02-22 LAB — HEMOGLOBIN A1C
Est. average glucose Bld gHb Est-mCnc: 85 mg/dL
Hgb A1c MFr Bld: 4.6 % — ABNORMAL LOW (ref 4.8–5.6)

## 2018-02-22 LAB — HEMOGLOBINOPATHY EVALUATION
HEMOGLOBIN F QUANTITATION: 0 % (ref 0.0–2.0)
HGB A: 97.6 % (ref 96.4–98.8)
HGB C: 0 %
HGB S: 0 %
HGB VARIANT: 0 %
Hemoglobin A2 Quantitation: 2.4 % (ref 1.8–3.2)

## 2018-02-23 LAB — CULTURE, OB URINE

## 2018-02-23 LAB — URINE CULTURE, OB REFLEX

## 2018-02-25 ENCOUNTER — Encounter: Payer: Self-pay | Admitting: Obstetrics & Gynecology

## 2018-02-25 ENCOUNTER — Other Ambulatory Visit: Payer: Self-pay | Admitting: Certified Nurse Midwife

## 2018-02-25 DIAGNOSIS — N760 Acute vaginitis: Secondary | ICD-10-CM

## 2018-02-25 DIAGNOSIS — B373 Candidiasis of vulva and vagina: Secondary | ICD-10-CM

## 2018-02-25 DIAGNOSIS — B9689 Other specified bacterial agents as the cause of diseases classified elsewhere: Secondary | ICD-10-CM

## 2018-02-25 DIAGNOSIS — E559 Vitamin D deficiency, unspecified: Secondary | ICD-10-CM | POA: Insufficient documentation

## 2018-02-25 DIAGNOSIS — B3731 Acute candidiasis of vulva and vagina: Secondary | ICD-10-CM

## 2018-02-25 DIAGNOSIS — Z3480 Encounter for supervision of other normal pregnancy, unspecified trimester: Secondary | ICD-10-CM

## 2018-02-25 DIAGNOSIS — Z8619 Personal history of other infectious and parasitic diseases: Secondary | ICD-10-CM | POA: Insufficient documentation

## 2018-02-25 DIAGNOSIS — O2343 Unspecified infection of urinary tract in pregnancy, third trimester: Secondary | ICD-10-CM

## 2018-02-25 LAB — CYTOLOGY - PAP: Diagnosis: NEGATIVE

## 2018-02-25 MED ORDER — AMOXICILLIN-POT CLAVULANATE 875-125 MG PO TABS: 1.0000 | ORAL_TABLET | Freq: Two times a day (BID) | ORAL | 0 refills | Status: DC

## 2018-02-25 MED ORDER — METRONIDAZOLE 500 MG PO TABS
500.0000 mg | ORAL_TABLET | Freq: Two times a day (BID) | ORAL | 0 refills | Status: DC
Start: 2018-02-25 — End: 2018-03-20

## 2018-02-25 MED ORDER — FLUCONAZOLE 150 MG PO TABS: 150.0000 mg | ORAL_TABLET | Freq: Once | ORAL | 0 refills | Status: AC

## 2018-02-25 MED ORDER — TERCONAZOLE 0.8 % VA CREA: 1.0000 | TOPICAL_CREAM | Freq: Every day | VAGINAL | 0 refills | Status: DC

## 2018-02-27 ENCOUNTER — Other Ambulatory Visit: Payer: Medicaid Other

## 2018-02-27 ENCOUNTER — Other Ambulatory Visit (HOSPITAL_COMMUNITY): Payer: Medicaid Other

## 2018-02-27 ENCOUNTER — Telehealth: Payer: Self-pay

## 2018-02-27 DIAGNOSIS — Z3480 Encounter for supervision of other normal pregnancy, unspecified trimester: Secondary | ICD-10-CM

## 2018-02-27 NOTE — Telephone Encounter (Signed)
Patient notified of results and Rx. 

## 2018-02-27 NOTE — Telephone Encounter (Signed)
-----   Message from Roe Coombsachelle A Denney, CNM sent at 02/25/2018  3:45 PM EDT ----- Please let her know that her vitamin D level is low, BV, yeast and a UTI;   Vitamin D weekly tablet has been sent to the pharmacy for you to take.   You also have a yeast infection.  Diflucan X1 dose and Terconazole vaginal cream was sent to the pharmacy.  Flagyl was sent to the pharmacy for the Bacterial vaginosis as well twice a day for 1 week.  Augmentin was sent in for the UTI    Thank you.  R.Denney CNM

## 2018-02-28 LAB — RPR: RPR Ser Ql: NONREACTIVE

## 2018-02-28 LAB — CBC
Hematocrit: 31.8 % — ABNORMAL LOW (ref 34.0–46.6)
Hemoglobin: 10.3 g/dL — ABNORMAL LOW (ref 11.1–15.9)
MCH: 31.5 pg (ref 26.6–33.0)
MCHC: 32.4 g/dL (ref 31.5–35.7)
MCV: 97 fL (ref 79–97)
PLATELETS: 227 10*3/uL (ref 150–379)
RBC: 3.27 x10E6/uL — ABNORMAL LOW (ref 3.77–5.28)
RDW: 12.9 % (ref 12.3–15.4)
WBC: 9.7 10*3/uL (ref 3.4–10.8)

## 2018-02-28 LAB — GLUCOSE TOLERANCE, 2 HOURS W/ 1HR
GLUCOSE, FASTING: 64 mg/dL — AB (ref 65–91)
Glucose, 1 hour: 85 mg/dL (ref 65–179)
Glucose, 2 hour: 75 mg/dL (ref 65–152)

## 2018-02-28 LAB — HIV ANTIBODY (ROUTINE TESTING W REFLEX): HIV Screen 4th Generation wRfx: NONREACTIVE

## 2018-03-03 ENCOUNTER — Encounter: Payer: Self-pay | Admitting: Certified Nurse Midwife

## 2018-03-03 ENCOUNTER — Other Ambulatory Visit: Payer: Self-pay | Admitting: Certified Nurse Midwife

## 2018-03-03 DIAGNOSIS — O99013 Anemia complicating pregnancy, third trimester: Secondary | ICD-10-CM

## 2018-03-03 DIAGNOSIS — Z3480 Encounter for supervision of other normal pregnancy, unspecified trimester: Secondary | ICD-10-CM

## 2018-03-03 HISTORY — DX: Anemia complicating pregnancy, third trimester: O99.013

## 2018-03-03 MED ORDER — CITRANATAL BLOOM 90-1 MG PO TABS
1.0000 | ORAL_TABLET | Freq: Every day | ORAL | 12 refills | Status: DC
Start: 2018-03-03 — End: 2018-04-03

## 2018-03-04 ENCOUNTER — Other Ambulatory Visit: Payer: Self-pay | Admitting: Certified Nurse Midwife

## 2018-03-04 DIAGNOSIS — Z3480 Encounter for supervision of other normal pregnancy, unspecified trimester: Secondary | ICD-10-CM

## 2018-03-04 LAB — INHERITEST CORE(CF97,SMA,FRAX)

## 2018-03-06 ENCOUNTER — Encounter: Payer: Self-pay | Admitting: Certified Nurse Midwife

## 2018-03-06 ENCOUNTER — Ambulatory Visit (INDEPENDENT_AMBULATORY_CARE_PROVIDER_SITE_OTHER): Payer: Medicaid Other | Admitting: Certified Nurse Midwife

## 2018-03-06 VITALS — BP 113/67 | HR 95 | Wt 177.0 lb

## 2018-03-06 DIAGNOSIS — O99013 Anemia complicating pregnancy, third trimester: Secondary | ICD-10-CM

## 2018-03-06 DIAGNOSIS — O0933 Supervision of pregnancy with insufficient antenatal care, third trimester: Secondary | ICD-10-CM

## 2018-03-06 DIAGNOSIS — E559 Vitamin D deficiency, unspecified: Secondary | ICD-10-CM

## 2018-03-06 DIAGNOSIS — Z3480 Encounter for supervision of other normal pregnancy, unspecified trimester: Secondary | ICD-10-CM

## 2018-03-06 NOTE — Progress Notes (Signed)
   PRENATAL VISIT NOTE  Subjective:  Tracy Barr is a 27 y.o. [phone] at [redacted]w[redacted]d being seen today for ongoing prenatal care.  She is currently monitored for the following issues for this low-risk pregnancy and has Supervision of other normal pregnancy, antepartum; Late prenatal care affecting pregnancy in third trimester; History of herpes genitalis; Vitamin D deficiency; and Anemia during pregnancy in third trimester on their problem list.  Patient reports no complaints.  Contractions: Not present. Vag. Bleeding: None.  Movement: Present. Denies leaking of fluid.   The following portions of the patient's history were reviewed and updated as appropriate: allergies, current medications, past family history, past medical history, past social history, past surgical history and problem list. Problem list updated.  Objective:   Vitals:   03/06/18 1512  BP: 113/67  Pulse: 95  Weight: 177 lb (80.3 kg)    Fetal Status: Fetal Heart Rate (bpm): 132; doppler Fundal Height: 31 cm Movement: Present     General:  Alert, oriented and cooperative. Patient is in no acute distress.  Skin: Skin is warm and dry. No rash noted.   Cardiovascular: Normal heart rate noted  Respiratory: Normal respiratory effort, no problems with respiration noted  Abdomen: Soft, gravid, appropriate for gestational age.  Pain/Pressure: Absent     Pelvic: Cervical exam deferred        Extremities: Normal range of motion.     Mental Status: Normal mood and affect. Normal behavior. Normal judgment and thought content.   Assessment and Plan:  Pregnancy: G4P1021 at [redacted]w[redacted]d  1. Supervision of other normal pregnancy, antepartum     Doing well.  Has f/u US scheduled.    2. Vitamin D deficiency     Taking weekly vitamin D  3. Late prenatal care affecting pregnancy in third trimester      weeks  4. Anemia during pregnancy in third trimester     Taking Bloom  Preterm labor symptoms and general obstetric precautions  including but not limited to vaginal bleeding, contractions, leaking of fluid and fetal movement were reviewed in detail with the patient. Please refer to After Visit Summary for other counseling recommendations.  Return in about 2 weeks (around 03/20/2018) for ROB.  Future Appointments  Date Time Provider Department Center  03/10/2018  3:15 PM WH-[facility] [place] 4 WH-MFCUS MFC-US    Roe Coombs, CNM

## 2018-03-10 ENCOUNTER — Ambulatory Visit (HOSPITAL_COMMUNITY)
Admission: RE | Admit: 2018-03-10 | Discharge: 2018-03-10 | Disposition: A | Discharge status: Home / Self Care | Payer: Medicaid Other | Source: Ambulatory Visit | Attending: Certified Nurse Midwife | Admitting: Certified Nurse Midwife

## 2018-03-10 DIAGNOSIS — Z363 Encounter for antenatal screening for malformations: Secondary | ICD-10-CM | POA: Diagnosis not present

## 2018-03-10 DIAGNOSIS — Z3480 Encounter for supervision of other normal pregnancy, unspecified trimester: Secondary | ICD-10-CM

## 2018-03-10 DIAGNOSIS — O0933 Supervision of pregnancy with insufficient antenatal care, third trimester: Secondary | ICD-10-CM | POA: Diagnosis present

## 2018-03-10 DIAGNOSIS — Z3A30 30 weeks gestation of pregnancy: Secondary | ICD-10-CM | POA: Insufficient documentation

## 2018-03-11 ENCOUNTER — Other Ambulatory Visit: Payer: Self-pay | Admitting: Certified Nurse Midwife

## 2018-03-11 DIAGNOSIS — Z3480 Encounter for supervision of other normal pregnancy, unspecified trimester: Secondary | ICD-10-CM

## 2018-03-11 DIAGNOSIS — O0933 Supervision of pregnancy with insufficient antenatal care, third trimester: Secondary | ICD-10-CM

## 2018-03-20 ENCOUNTER — Encounter: Payer: Self-pay | Admitting: Certified Nurse Midwife

## 2018-03-20 ENCOUNTER — Ambulatory Visit (INDEPENDENT_AMBULATORY_CARE_PROVIDER_SITE_OTHER): Payer: Medicaid Other | Admitting: Certified Nurse Midwife

## 2018-03-20 VITALS — BP 123/69 | HR 90 | Wt 180.0 lb

## 2018-03-20 DIAGNOSIS — O0933 Supervision of pregnancy with insufficient antenatal care, third trimester: Secondary | ICD-10-CM

## 2018-03-20 DIAGNOSIS — Z8619 Personal history of other infectious and parasitic diseases: Secondary | ICD-10-CM

## 2018-03-20 DIAGNOSIS — D649 Anemia, unspecified: Secondary | ICD-10-CM

## 2018-03-20 DIAGNOSIS — O99013 Anemia complicating pregnancy, third trimester: Secondary | ICD-10-CM

## 2018-03-20 DIAGNOSIS — E559 Vitamin D deficiency, unspecified: Secondary | ICD-10-CM

## 2018-03-20 DIAGNOSIS — Z3480 Encounter for supervision of other normal pregnancy, unspecified trimester: Secondary | ICD-10-CM

## 2018-03-20 NOTE — Progress Notes (Signed)
   PRENATAL VISIT NOTE  Subjective:  Tracy Barr is a 27 y.o. [phone] at [redacted]w[redacted]d being seen today for ongoing prenatal care.  She is currently monitored for the following issues for this low-risk pregnancy and has Supervision of other normal pregnancy, antepartum; Late prenatal care affecting pregnancy in third trimester; History of herpes genitalis; Vitamin D deficiency; and Anemia during pregnancy in third trimester on their problem list.  Patient reports no complaints.  Contractions: Not present. Vag. Bleeding: None.  Movement: Present. Denies leaking of fluid.   The following portions of the patient's history were reviewed and updated as appropriate: allergies, current medications, past family history, past medical history, past social history, past surgical history and problem list. Problem list updated.  Objective:   Vitals:   03/20/18 1600  BP: 123/69  Pulse: 90  Weight: 180 lb (81.6 kg)    Fetal Status: Fetal Heart Rate (bpm): 134; doppler Fundal Height: 32 cm Movement: Present     General:  Alert, oriented and cooperative. Patient is in no acute distress.  Skin: Skin is warm and dry. No rash noted.   Cardiovascular: Normal heart rate noted  Respiratory: Normal respiratory effort, no problems with respiration noted  Abdomen: Soft, gravid, appropriate for gestational age.  Pain/Pressure: Absent     Pelvic: Cervical exam deferred        Extremities: Normal range of motion.  Edema: None  Mental Status: Normal mood and affect. Normal behavior. Normal judgment and thought content.   Assessment and Plan:  Pregnancy: G4P1021 at [redacted]w[redacted]d  1. Supervision of other normal pregnancy, antepartum     Doing well  2. Vitamin D deficiency     Taking weekly vitamin D  3. Late prenatal care affecting pregnancy in third trimester     At 27 wks  4. History of herpes genitalis     On Valtrex  5. Anemia during pregnancy in third trimester     Slightly anemic, Bloom Rx previously  sent, discussed today  Preterm labor symptoms and general obstetric precautions including but not limited to vaginal bleeding, contractions, leaking of fluid and fetal movement were reviewed in detail with the patient. Please refer to After Visit Summary for other counseling recommendations.  Return in about 2 weeks (around 04/03/2018) for ROB& urine culture.  Future Appointments  Date Time Provider Department Center  04/11/2018  3:15 PM WH-MFC [place] 4 WH-MFCUS MFC-US    Roe Coombs, CNM

## 2018-04-03 ENCOUNTER — Encounter: Payer: Self-pay | Admitting: Certified Nurse Midwife

## 2018-04-03 ENCOUNTER — Ambulatory Visit (INDEPENDENT_AMBULATORY_CARE_PROVIDER_SITE_OTHER): Payer: Medicaid Other | Admitting: Certified Nurse Midwife

## 2018-04-03 VITALS — BP 113/69 | HR 81 | Wt 185.7 lb

## 2018-04-03 DIAGNOSIS — Z3480 Encounter for supervision of other normal pregnancy, unspecified trimester: Secondary | ICD-10-CM

## 2018-04-03 DIAGNOSIS — O99013 Anemia complicating pregnancy, third trimester: Secondary | ICD-10-CM

## 2018-04-03 DIAGNOSIS — O2343 Unspecified infection of urinary tract in pregnancy, third trimester: Secondary | ICD-10-CM

## 2018-04-03 DIAGNOSIS — E559 Vitamin D deficiency, unspecified: Secondary | ICD-10-CM

## 2018-04-03 DIAGNOSIS — Z8619 Personal history of other infectious and parasitic diseases: Secondary | ICD-10-CM

## 2018-04-03 MED ORDER — VALACYCLOVIR HCL 500 MG PO TABS: 500.0000 mg | ORAL_TABLET | Freq: Two times a day (BID) | ORAL | 2 refills | Status: DC

## 2018-04-03 NOTE — Progress Notes (Signed)
   PRENATAL VISIT NOTE  Subjective:  Tracy Barr is a 27 y.o. [phone] at [redacted]w[redacted]d being seen today for ongoing prenatal care.  She is currently monitored for the following issues for this low-risk pregnancy and has Supervision of other normal pregnancy, antepartum; Late prenatal care affecting pregnancy in third trimester; History of herpes genitalis; Vitamin D deficiency; and Anemia during pregnancy in third trimester on their problem list.  Patient reports no complaints.  Contractions: Not present. Vag. Bleeding: None.  Movement: Present. Denies leaking of fluid.   The following portions of the patient's history were reviewed and updated as appropriate: allergies, current medications, past family history, past medical history, past social history, past surgical history and problem list. Problem list updated.  Objective:   Vitals:   04/03/18 1327  BP: 113/69  Pulse: 81  Weight: 185 lb 11.2 oz (84.2 kg)    Fetal Status: Fetal Heart Rate (bpm): 135; doppler Fundal Height: 32 cm Movement: Present     General:  Alert, oriented and cooperative. Patient is in no acute distress.  Skin: Skin is warm and dry. No rash noted.   Cardiovascular: Normal heart rate noted  Respiratory: Normal respiratory effort, no problems with respiration noted  Abdomen: Soft, gravid, appropriate for gestational age.  Pain/Pressure: Absent     Pelvic: Cervical exam deferred        Extremities: Normal range of motion.  Edema: None  Mental Status: Normal mood and affect. Normal behavior. Normal judgment and thought content.   Assessment and Plan:  Pregnancy: G4P1021 at [redacted]w[redacted]d  1. Supervision of other normal pregnancy, antepartum     Doing well.  Waterbirth class planned. Has f/u [place] scheduled for 04/11/18 - Culture, OB Urine  2. Vitamin D deficiency     Taking weekly vitamin D  3. Anemia during pregnancy in third trimester     Stable HGB: 10.3 02/27/18.  Taking iron supplementation  4. UTI (urinary tract  infection) during pregnancy, third trimester     TOC today - Culture, OB Urine  5. History of herpes genitalis     Taking Valtrex for suppression.  - valACYclovir (VALTREX) 500 MG tablet; Take 1 tablet (500 mg total) by mouth 2 (two) times daily.  Dispense: 30 tablet; Refill: 2  Preterm labor symptoms and general obstetric precautions including but not limited to vaginal bleeding, contractions, leaking of fluid and fetal movement were reviewed in detail with the patient. Please refer to After Visit Summary for other counseling recommendations.  Return in about 2 weeks (around 04/17/2018) for ROB, GBS.  Future Appointments  Date Time Provider Department Center  04/11/2018  3:15 PM WH-[facility] [place] 4 WH-MFCUS MFC-US    Roe Coombs, CNM

## 2018-04-07 LAB — URINE CULTURE, OB REFLEX

## 2018-04-07 LAB — CULTURE, OB URINE

## 2018-04-08 ENCOUNTER — Other Ambulatory Visit: Payer: Self-pay | Admitting: Certified Nurse Midwife

## 2018-04-08 DIAGNOSIS — O2343 Unspecified infection of urinary tract in pregnancy, third trimester: Secondary | ICD-10-CM

## 2018-04-08 MED ORDER — CEFIXIME 400 MG PO CAPS: 400.0000 mg | ORAL_CAPSULE | Freq: Every day | ORAL | 2 refills | Status: DC

## 2018-04-11 ENCOUNTER — Ambulatory Visit (HOSPITAL_COMMUNITY)
Admission: RE | Admit: 2018-04-11 | Discharge: 2018-04-11 | Disposition: A | Discharge status: Home / Self Care | Payer: Medicaid Other | Source: Ambulatory Visit | Attending: Certified Nurse Midwife | Admitting: Certified Nurse Midwife

## 2018-04-11 ENCOUNTER — Other Ambulatory Visit: Payer: Self-pay | Admitting: Certified Nurse Midwife

## 2018-04-11 DIAGNOSIS — Z362 Encounter for other antenatal screening follow-up: Secondary | ICD-10-CM | POA: Insufficient documentation

## 2018-04-11 DIAGNOSIS — O0933 Supervision of pregnancy with insufficient antenatal care, third trimester: Secondary | ICD-10-CM | POA: Diagnosis not present

## 2018-04-11 DIAGNOSIS — Z3480 Encounter for supervision of other normal pregnancy, unspecified trimester: Secondary | ICD-10-CM

## 2018-04-11 DIAGNOSIS — Z3483 Encounter for supervision of other normal pregnancy, third trimester: Secondary | ICD-10-CM | POA: Insufficient documentation

## 2018-04-11 DIAGNOSIS — Z3A34 34 weeks gestation of pregnancy: Secondary | ICD-10-CM

## 2018-04-14 ENCOUNTER — Other Ambulatory Visit: Payer: Self-pay | Admitting: Certified Nurse Midwife

## 2018-04-14 DIAGNOSIS — Z3480 Encounter for supervision of other normal pregnancy, unspecified trimester: Secondary | ICD-10-CM

## 2018-04-24 ENCOUNTER — Ambulatory Visit (INDEPENDENT_AMBULATORY_CARE_PROVIDER_SITE_OTHER): Payer: Medicaid Other | Admitting: Certified Nurse Midwife

## 2018-04-24 ENCOUNTER — Other Ambulatory Visit (HOSPITAL_COMMUNITY)
Admission: RE | Admit: 2018-04-24 | Discharge: 2018-04-24 | Disposition: A | Discharge status: Home / Self Care | Payer: Medicaid Other | Source: Ambulatory Visit | Attending: Certified Nurse Midwife | Admitting: Certified Nurse Midwife

## 2018-04-24 VITALS — BP 121/76 | HR 110 | Wt 192.0 lb

## 2018-04-24 DIAGNOSIS — E559 Vitamin D deficiency, unspecified: Secondary | ICD-10-CM

## 2018-04-24 DIAGNOSIS — Z3483 Encounter for supervision of other normal pregnancy, third trimester: Secondary | ICD-10-CM

## 2018-04-24 DIAGNOSIS — Z8619 Personal history of other infectious and parasitic diseases: Secondary | ICD-10-CM

## 2018-04-24 DIAGNOSIS — Z3480 Encounter for supervision of other normal pregnancy, unspecified trimester: Secondary | ICD-10-CM | POA: Insufficient documentation

## 2018-04-24 DIAGNOSIS — O99013 Anemia complicating pregnancy, third trimester: Secondary | ICD-10-CM

## 2018-04-24 NOTE — Progress Notes (Signed)
   PRENATAL VISIT NOTE  Subjective:  Tracy Barr is a 27 y.o. [phone] at 6610w4d being seen today for ongoing prenatal care.  She is currently monitored for the following issues for this low-risk pregnancy and has Supervision of other normal pregnancy, antepartum; Late prenatal care affecting pregnancy in third trimester; History of herpes genitalis; Vitamin D deficiency; and Anemia during pregnancy in third trimester on their problem list.  Patient reports no complaints.  Contractions: Not present. Vag. Bleeding: None.  Movement: Present. Denies leaking of fluid.   The following portions of the patient's history were reviewed and updated as appropriate: allergies, current medications, past family history, past medical history, past social history, past surgical history and problem list. Problem list updated.  Objective:   Vitals:   04/24/18 1338  BP: 121/76  Pulse: (!) 110  Weight: 192 lb (87.1 kg)    Fetal Status: Fetal Heart Rate (bpm): 140; doppler Fundal Height: 35 cm Movement: Present  Presentation: Vertex  General:  Alert, oriented and cooperative. Patient is in no acute distress.  Skin: Skin is warm and dry. No rash noted.   Cardiovascular: Normal heart rate noted  Respiratory: Normal respiratory effort, no problems with respiration noted  Abdomen: Soft, gravid, appropriate for gestational age.  Pain/Pressure: Absent     Pelvic: Cervical exam performed Dilation: Closed Effacement (%): Thick Station: Ballotable, not in pelvis  Extremities: Normal range of motion.     Mental Status: Normal mood and affect. Normal behavior. Normal judgment and thought content.   Assessment and Plan:  Pregnancy: G4P1021 at 6410w4d  1. Supervision of other normal pregnancy, antepartum     Doing well.  Waterbirth planned, certificate brought today.  - Strep Gp B NAA - Cervicovaginal ancillary only  2. Vitamin D deficiency      Taking weekly vitamin D  3. History of herpes genitalis  Taking Valtrex  4. Anemia during pregnancy in third trimester     Taking Bloom  Preterm labor symptoms and general obstetric precautions including but not limited to vaginal bleeding, contractions, leaking of fluid and fetal movement were reviewed in detail with the patient. Please refer to After Visit Summary for other counseling recommendations.  Return in about 1 week (around 05/01/2018) for ROB; waterbirth consent form.  No future appointments.  Roe Coombsachelle A Veta Dambrosia, CNM

## 2018-04-25 LAB — CERVICOVAGINAL ANCILLARY ONLY
Chlamydia: NEGATIVE
Neisseria Gonorrhea: NEGATIVE

## 2018-04-26 LAB — STREP GP B NAA: Strep Gp B NAA: NEGATIVE

## 2018-04-29 ENCOUNTER — Other Ambulatory Visit: Payer: Self-pay | Admitting: Certified Nurse Midwife

## 2018-04-29 DIAGNOSIS — Z3480 Encounter for supervision of other normal pregnancy, unspecified trimester: Secondary | ICD-10-CM

## 2018-05-01 ENCOUNTER — Ambulatory Visit (INDEPENDENT_AMBULATORY_CARE_PROVIDER_SITE_OTHER): Payer: Medicaid Other | Admitting: Certified Nurse Midwife

## 2018-05-01 ENCOUNTER — Encounter: Payer: Self-pay | Admitting: Certified Nurse Midwife

## 2018-05-01 VITALS — BP 113/68 | HR 91 | Wt 189.2 lb

## 2018-05-01 DIAGNOSIS — O99013 Anemia complicating pregnancy, third trimester: Secondary | ICD-10-CM

## 2018-05-01 DIAGNOSIS — Z3483 Encounter for supervision of other normal pregnancy, third trimester: Secondary | ICD-10-CM

## 2018-05-01 DIAGNOSIS — Z3480 Encounter for supervision of other normal pregnancy, unspecified trimester: Secondary | ICD-10-CM

## 2018-05-01 DIAGNOSIS — E559 Vitamin D deficiency, unspecified: Secondary | ICD-10-CM

## 2018-05-01 DIAGNOSIS — O0933 Supervision of pregnancy with insufficient antenatal care, third trimester: Secondary | ICD-10-CM

## 2018-05-01 DIAGNOSIS — Z8619 Personal history of other infectious and parasitic diseases: Secondary | ICD-10-CM

## 2018-05-01 NOTE — Progress Notes (Signed)
   PRENATAL VISIT NOTE  Subjective:  Tracy Barr is a 27 y.o. [phone] at 6479w4d being seen today for ongoing prenatal care.  She is currently monitored for the following issues for this low-risk pregnancy and has Supervision of other normal pregnancy, antepartum; Late prenatal care affecting pregnancy in third trimester; History of herpes genitalis; Vitamin D deficiency; and Anemia during pregnancy in third trimester on their problem list.  Patient reports no complaints.  Contractions: Not present. Vag. Bleeding: None.  Movement: Present. Denies leaking of fluid.   The following portions of the patient's history were reviewed and updated as appropriate: allergies, current medications, past family history, past medical history, past social history, past surgical history and problem list. Problem list updated.  Objective:   Vitals:   05/01/18 1339  BP: 113/68  Pulse: 91  Weight: 189 lb 3.2 oz (85.8 kg)    Fetal Status: Fetal Heart Rate (bpm): 137; doppler Fundal Height: 36 cm Movement: Present     General:  Alert, oriented and cooperative. Patient is in no acute distress.  Skin: Skin is warm and dry. No rash noted.   Cardiovascular: Normal heart rate noted  Respiratory: Normal respiratory effort, no problems with respiration noted  Abdomen: Soft, gravid, appropriate for gestational age.  Pain/Pressure: Absent     Pelvic: Cervical exam deferred        Extremities: Normal range of motion.  Edema: None  Mental Status: Normal mood and affect. Normal behavior. Normal judgment and thought content.   Assessment and Plan:  Pregnancy: G4P1021 at 6079w4d  1. Supervision of other normal pregnancy, antepartum     Doing well  2. Vitamin D deficiency     Taking weekly vitamin D  3. Late prenatal care affecting pregnancy in third trimester     3rd trimester  4. History of herpes genitalis     Taking Valtrex for suppression since 28 wks  5. Anemia during pregnancy in third trimester    Taking iron  Term labor symptoms and general obstetric precautions including but not limited to vaginal bleeding, contractions, leaking of fluid and fetal movement were reviewed in detail with the patient. Please refer to After Visit Summary for other counseling recommendations.  Return in about 1 week (around 05/08/2018) for ROB.  Future Appointments  Date Time Provider Department Center  05/07/2018  4:15 PM Roe Coombsenney, Lydia Toren A, CNM [facility] None    Roe Coombsachelle A Weldon Nouri, CNM

## 2018-05-07 ENCOUNTER — Ambulatory Visit (INDEPENDENT_AMBULATORY_CARE_PROVIDER_SITE_OTHER): Payer: Medicaid Other | Admitting: Certified Nurse Midwife

## 2018-05-07 ENCOUNTER — Encounter: Payer: Self-pay | Admitting: *Deleted

## 2018-05-07 ENCOUNTER — Encounter: Payer: Self-pay | Admitting: Certified Nurse Midwife

## 2018-05-07 VITALS — BP 115/72 | HR 94 | Wt 192.0 lb

## 2018-05-07 DIAGNOSIS — E559 Vitamin D deficiency, unspecified: Secondary | ICD-10-CM

## 2018-05-07 DIAGNOSIS — Z3480 Encounter for supervision of other normal pregnancy, unspecified trimester: Secondary | ICD-10-CM

## 2018-05-07 DIAGNOSIS — O99013 Anemia complicating pregnancy, third trimester: Secondary | ICD-10-CM

## 2018-05-07 NOTE — Progress Notes (Signed)
   PRENATAL VISIT NOTE  Subjective:  Tracy Barr is a 27 y.o. [phone] at 4146w3d being seen today for ongoing prenatal care.  She is currently monitored for the following issues for this low-risk pregnancy and has Supervision of other normal pregnancy, antepartum; Late prenatal care affecting pregnancy in third trimester; History of herpes genitalis; Vitamin D deficiency; and Anemia during pregnancy in third trimester on their problem list.  Patient reports no complaints.  Contractions: Not present. Vag. Bleeding: None.  Movement: Present. Denies leaking of fluid.   The following portions of the patient's history were reviewed and updated as appropriate: allergies, current medications, past family history, past medical history, past social history, past surgical history and problem list. Problem list updated.  Objective:   Vitals:   05/07/18 1632  BP: 115/72  Pulse: 94  Weight: 192 lb (87.1 kg)    Fetal Status: Fetal Heart Rate (bpm): 140; doppler Fundal Height: 37 cm Movement: Present     General:  Alert, oriented and cooperative. Patient is in no acute distress.  Skin: Skin is warm and dry. No rash noted.   Cardiovascular: Normal heart rate noted  Respiratory: Normal respiratory effort, no problems with respiration noted  Abdomen: Soft, gravid, appropriate for gestational age.  Pain/Pressure: Absent     Pelvic: Cervical exam deferred        Extremities: Normal range of motion.  Edema: None  Mental Status: Normal mood and affect. Normal behavior. Normal judgment and thought content.   Assessment and Plan:  Pregnancy: G4P1021 at 9046w3d  1. Supervision of other normal pregnancy, antepartum      Doing well.  Waterbirth planned.   2. Vitamin D deficiency     Taking weekly vitamin D  3. Anemia during pregnancy in third trimester     Taking iron.    Term labor symptoms and general obstetric precautions including but not limited to vaginal bleeding, contractions, leaking of  fluid and fetal movement were reviewed in detail with the patient. Please refer to After Visit Summary for other counseling recommendations.  Return in about 1 week (around 05/14/2018) for ROB.  No future appointments.  Roe Coombsachelle A Denney, CNM

## 2018-05-13 ENCOUNTER — Encounter: Payer: Medicaid Other | Admitting: Certified Nurse Midwife

## 2018-05-14 ENCOUNTER — Encounter (HOSPITAL_COMMUNITY): Payer: Self-pay

## 2018-05-14 ENCOUNTER — Inpatient Hospital Stay (HOSPITAL_COMMUNITY)
Admission: AD | Admit: 2018-05-14 | Discharge: 2018-05-15 | DRG: 806 | Disposition: A | Discharge status: Home / Self Care | Payer: Medicaid Other | Attending: Obstetrics and Gynecology | Admitting: Obstetrics and Gynecology

## 2018-05-14 DIAGNOSIS — Z87891 Personal history of nicotine dependence: Secondary | ICD-10-CM | POA: Diagnosis not present

## 2018-05-14 DIAGNOSIS — O0933 Supervision of pregnancy with insufficient antenatal care, third trimester: Secondary | ICD-10-CM

## 2018-05-14 DIAGNOSIS — A6000 Herpesviral infection of urogenital system, unspecified: Secondary | ICD-10-CM | POA: Diagnosis present

## 2018-05-14 DIAGNOSIS — O9832 Other infections with a predominantly sexual mode of transmission complicating childbirth: Secondary | ICD-10-CM | POA: Diagnosis present

## 2018-05-14 DIAGNOSIS — E559 Vitamin D deficiency, unspecified: Secondary | ICD-10-CM | POA: Diagnosis present

## 2018-05-14 DIAGNOSIS — Z8619 Personal history of other infectious and parasitic diseases: Secondary | ICD-10-CM

## 2018-05-14 DIAGNOSIS — O9902 Anemia complicating childbirth: Principal | ICD-10-CM | POA: Diagnosis present

## 2018-05-14 DIAGNOSIS — D649 Anemia, unspecified: Secondary | ICD-10-CM | POA: Diagnosis present

## 2018-05-14 DIAGNOSIS — Z3480 Encounter for supervision of other normal pregnancy, unspecified trimester: Secondary | ICD-10-CM

## 2018-05-14 DIAGNOSIS — O99013 Anemia complicating pregnancy, third trimester: Secondary | ICD-10-CM | POA: Diagnosis present

## 2018-05-14 DIAGNOSIS — Z3483 Encounter for supervision of other normal pregnancy, third trimester: Secondary | ICD-10-CM | POA: Diagnosis present

## 2018-05-14 DIAGNOSIS — Z3A39 39 weeks gestation of pregnancy: Secondary | ICD-10-CM | POA: Diagnosis not present

## 2018-05-14 LAB — TYPE AND SCREEN
ABO/RH(D): B POS
ANTIBODY SCREEN: NEGATIVE

## 2018-05-14 LAB — CBC
HCT: 32 % — ABNORMAL LOW (ref 36.0–46.0)
HEMOGLOBIN: 10.7 g/dL — AB (ref 12.0–15.0)
MCH: 30.7 pg (ref 26.0–34.0)
MCHC: 33.4 g/dL (ref 30.0–36.0)
MCV: 91.7 fL (ref 78.0–100.0)
Platelets: 190 10*3/uL (ref 150–400)
RBC: 3.49 MIL/uL — AB (ref 3.87–5.11)
RDW: 12.9 % (ref 11.5–15.5)
WBC: 14.9 10*3/uL — ABNORMAL HIGH (ref 4.0–10.5)

## 2018-05-14 LAB — RPR: RPR: NONREACTIVE

## 2018-05-14 MED ORDER — OXYTOCIN 40 UNITS IN LACTATED RINGERS INFUSION - SIMPLE MED
2.5000 [IU]/h | INTRAVENOUS | Status: DC
  Administered 2018-05-14: 2.5 [IU]/h via INTRAVENOUS
  Filled 2018-05-14: qty 1000

## 2018-05-14 MED ORDER — WITCH HAZEL-GLYCERIN EX PADS: 1.0000 "application " | MEDICATED_PAD | CUTANEOUS | Status: DC | PRN

## 2018-05-14 MED ORDER — ZOLPIDEM TARTRATE 5 MG PO TABS: 5.0000 mg | ORAL_TABLET | Freq: Every evening | ORAL | Status: DC | PRN

## 2018-05-14 MED ORDER — ONDANSETRON HCL 4 MG/2ML IJ SOLN: 4.0000 mg | INTRAMUSCULAR | Status: DC | PRN

## 2018-05-14 MED ORDER — FENTANYL CITRATE (PF) 100 MCG/2ML IJ SOLN: 100.0000 ug | INTRAMUSCULAR | Status: DC | PRN

## 2018-05-14 MED ORDER — COCONUT OIL OIL: 1.0000 "application " | TOPICAL_OIL | Status: DC | PRN

## 2018-05-14 MED ORDER — OXYTOCIN BOLUS FROM INFUSION
500.0000 mL | Freq: Once | INTRAVENOUS | Status: AC
  Administered 2018-05-14: 500 mL via INTRAVENOUS

## 2018-05-14 MED ORDER — DIPHENHYDRAMINE HCL 25 MG PO CAPS: 25.0000 mg | ORAL_CAPSULE | Freq: Four times a day (QID) | ORAL | Status: DC | PRN

## 2018-05-14 MED ORDER — ONDANSETRON HCL 4 MG PO TABS: 4.0000 mg | ORAL_TABLET | ORAL | Status: DC | PRN

## 2018-05-14 MED ORDER — SIMETHICONE 80 MG PO CHEW: 80.0000 mg | CHEWABLE_TABLET | ORAL | Status: DC | PRN

## 2018-05-14 MED ORDER — DIBUCAINE 1 % RE OINT: 1.0000 "application " | TOPICAL_OINTMENT | RECTAL | Status: DC | PRN

## 2018-05-14 MED ORDER — SENNOSIDES-DOCUSATE SODIUM 8.6-50 MG PO TABS
2.0000 | ORAL_TABLET | ORAL | Status: DC
  Administered 2018-05-15: 2 via ORAL
  Filled 2018-05-14: qty 2

## 2018-05-14 MED ORDER — LIDOCAINE HCL (PF) 1 % IJ SOLN
30.0000 mL | INTRAMUSCULAR | Status: DC | PRN
  Administered 2018-05-14: 30 mL via SUBCUTANEOUS
  Filled 2018-05-14: qty 30

## 2018-05-14 MED ORDER — TETANUS-DIPHTH-ACELL PERTUSSIS 5-2.5-18.5 LF-MCG/0.5 IM SUSP: 0.5000 mL | Freq: Once | INTRAMUSCULAR | Status: DC

## 2018-05-14 MED ORDER — SOD CITRATE-CITRIC ACID 500-334 MG/5ML PO SOLN: 30.0000 mL | ORAL | Status: DC | PRN

## 2018-05-14 MED ORDER — ONDANSETRON HCL 4 MG/2ML IJ SOLN: 4.0000 mg | Freq: Four times a day (QID) | INTRAMUSCULAR | Status: DC | PRN

## 2018-05-14 MED ORDER — ACETAMINOPHEN 325 MG PO TABS: 650.0000 mg | ORAL_TABLET | ORAL | Status: DC | PRN

## 2018-05-14 MED ORDER — OXYCODONE-ACETAMINOPHEN 5-325 MG PO TABS: 1.0000 | ORAL_TABLET | ORAL | Status: DC | PRN

## 2018-05-14 MED ORDER — LACTATED RINGERS IV SOLN: 500.0000 mL | INTRAVENOUS | Status: DC | PRN

## 2018-05-14 MED ORDER — BENZOCAINE-MENTHOL 20-0.5 % EX AERO: 1.0000 "application " | INHALATION_SPRAY | CUTANEOUS | Status: DC | PRN

## 2018-05-14 MED ORDER — IBUPROFEN 600 MG PO TABS
600.0000 mg | ORAL_TABLET | Freq: Four times a day (QID) | ORAL | Status: DC
  Administered 2018-05-15 (×2): 600 mg via ORAL
  Filled 2018-05-14 (×4): qty 1

## 2018-05-14 MED ORDER — LACTATED RINGERS IV SOLN: INTRAVENOUS | Status: DC

## 2018-05-14 MED ORDER — OXYCODONE-ACETAMINOPHEN 5-325 MG PO TABS: 2.0000 | ORAL_TABLET | ORAL | Status: DC | PRN

## 2018-05-14 MED ORDER — PRENATAL MULTIVITAMIN CH
1.0000 | ORAL_TABLET | Freq: Every day | ORAL | Status: DC
  Filled 2018-05-14: qty 1

## 2018-05-14 NOTE — H&P (Signed)
Tracy Barr is a 27 y.o. female presenting for contractions/laborsince 05/13/18 at 0800. Patient is planning a waterbirth, and had a waterbirth with her last delivery. No complications with this pregnancy or prior pregnancy. Currently laboring in the tub, coping well with contractions.   OB History    Gravida  4   Para  1   Term  1   Preterm      AB  2   Living  1     SAB  1   TAB  1   Ectopic      Multiple  0   Live Births  1          Past Medical History:  Diagnosis Date  . Anemia during pregnancy in third trimester 03/03/2018  . Chronic back pain   . Herpes   . Infection    UTI   Past Surgical History:  Procedure Laterality Date  . NO PAST SURGERIES     Family History: family history includes Diabetes in her maternal grandfather, paternal grandfather, and paternal grandmother; Heart disease in her maternal grandfather, paternal grandfather, and paternal grandmother; Hypertension in her maternal grandmother, paternal grandfather, and paternal grandmother. Social History:  reports that she has quit smoking. Her smoking use included cigarettes. She has quit using smokeless tobacco. She reports that she does not drink alcohol or use drugs.   Nursing Staff Provider  Office Location  [place] Dating  LMP  Language  ENGLISH Anatomy US  Normal at 35wks  Flu Vaccine   Genetic Screen  NIPS:   AFP:too late    TDaP vaccine   declined 03/20/18 Hgb A1C or  GTT Early A1C: normal Third trimester Normal  Rhogam  N/a B+   LAB RESULTS   Feeding Plan Breast Blood Type B/Positive/-- (04/18 1537)   Contraception None 02/20/18 Antibody Negative (04/18 1537)  Circumcision Yes if a Boy  Rubella 2.12 (04/18 1537)  Pediatrician  [facility]  RPR Non Reactive (04/25 1133)   Support Person FOB  HBsAg Negative (04/18 1537)   Prenatal Classes Not Interested  HIV Non Reactive (04/25 1133)  BTL Consent  GBS Negative (06/20 1443)   VBAC Consent  Pap 02/20/18: normal    Hgb  Electro  Normal    CF Neg    SMA/Fragile X Neg    Waterbirth  Arly.Keller[X ] Class Arly.Keller[X ] Consent Arly.Keller[X ] CNM visit      Maternal Diabetes: No Genetic Screening: Declined Maternal Ultrasounds/Referrals: Normal Fetal Ultrasounds or other Referrals:  None Maternal Substance Abuse:  No Significant Maternal Medications:  None Significant Maternal Lab Results:  None Other Comments:  None  Review of Systems  All other systems reviewed and are negative.  Maternal Medical History:  Reason for admission: Contractions.   Contractions: Onset was 13-24 hours ago.   Frequency: regular.   Perceived severity is strong.    Fetal activity: Perceived fetal activity is normal.   Last perceived fetal movement was within the past hour.    Prenatal complications: no prenatal complications Prenatal Complications - Diabetes: none.    Dilation: 6 Effacement (%): 70 Station: -2 Exam by:: Roxan Hockeyraci Lytle RN  Blood pressure 109/69, pulse 97, temperature 99.1 F (37.3 C), temperature source Oral, resp. rate 18, height 5\' 3"  (1.6 m), weight 192 lb (87.1 kg), last menstrual period 08/11/2017, SpO2 99 %, unknown if currently breastfeeding. Maternal Exam:  Uterine Assessment: Contraction strength is firm.  Contraction frequency is regular.   Abdomen: Patient reports no  abdominal tenderness. Pelvis: adequate for delivery.      Fetal Exam Fetal Monitor Review: Mode: hand-held doppler probe.   Baseline rate: 145.      Physical Exam  Nursing note and vitals reviewed. Constitutional: She is oriented to person, place, and time. She appears well-developed and well-nourished. No distress.  HENT:  Head: Normocephalic.  Cardiovascular: Normal rate.  Respiratory: Effort normal.  GI: Soft. There is no tenderness.  Neurological: She is alert and oriented to person, place, and time.  Skin: Skin is warm and dry.  Psychiatric: She has a normal mood and affect.    Prenatal labs: ABO, Rh: --/--/B POS (07/10  1610) Antibody: NEG (07/10 0713) Rubella: 2.12 (04/18 1537) RPR: Non Reactive (04/25 1133)  HBsAg: Negative (04/18 1537)  HIV: Non Reactive (04/25 1133)  GBS: Negative (06/20 1443)   Assessment/Plan: 27 y.o. [id number] at [redacted]w[redacted]d  Active labor Desires water birth (class, consent and CNM visit done during prenatal care) Reassuring M-F status anticipate NSVD, waterbirth   Thressa Sheller 05/14/2018, 9:43 AM

## 2018-05-14 NOTE — Progress Notes (Signed)
Tracy Barr is a 27 y.o. [id number] at 4084w3d  admitted for active labor  Subjective:  Feeling some pressure and involuntary urge to push with contractions. RN was unsure if there was FHR deceleration as she heart a HR of 80-90 with intermittent auscultation.  Objective: BP 123/70   Pulse 94   Temp 98.8 F (37.1 C) (Oral)   Resp 18   Ht 5\' 3"  (1.6 m)   Wt 192 lb (87.1 kg)   LMP 08/11/2017 (Exact Date)   SpO2 99%   BMI 34.01 kg/m  No intake/output data recorded. No intake/output data recorded.  FHT:  FHR: 130 bpm, variability: moderate,  accelerations:  Present,  decelerations:  Absent UC:   regular, every 2 minutes SVE:   8-9/100/-1/BBOW  Offered patient AROM, but she declines at this time.  Labs: Lab Results  Component Value Date   WBC 14.9 (H) 05/14/2018   HGB 10.7 (L) 05/14/2018   HCT 32.0 (L) 05/14/2018   MCV 91.7 05/14/2018   PLT 190 05/14/2018    Assessment / Plan: Spontaneous labor, progressing normally  Labor: Progressing normally Preeclampsia:  NA Fetal Wellbeing:  Category I, likely maternal HR was heard as FHR tracing is normal at this time, and maternal HR is 80-90. Ok for patient to get back into to tub when desired.  Pain Control:  Water tub I/D:  n/a Anticipated MOD:  NSVD  Thressa ShellerHeather Hogan 05/14/2018, 12:13 PM

## 2018-05-14 NOTE — MAU Note (Signed)
Pt contracting since 0700 Tuesday. Now 3-4 mins apart. Lost mucous plug. Denies LOF; denies bleeding. +FM

## 2018-05-14 NOTE — Anesthesia Pain Management Evaluation Note (Signed)
  CRNA Pain Management Visit Note  Patient: Tracy Barr, 27 y.o., female  "Hello I am a member of the anesthesia team at [facility]. We have an anesthesia team available at all times to provide care throughout the hospital, including epidural management and anesthesia for C-section. I don't know your plan for the delivery whether it a natural birth, water birth, IV sedation, nitrous supplementation, doula or epidural, but we want to meet your pain goals."   1.Was your pain managed to your expectations on prior hospitalizations?   Yes   2.What is your expectation for pain management during this hospitalization?     Water tub  3.How can we help you reach that goal? Be available;pt wants no information regarding pain options per nurse  Record the patient's initial score and the patient's pain goal.   Pain: Patient sleeping - unable to assess  Pain Goal: Patient sleeping - unable to assess [facility] wants you to be able to say your pain was always managed very well.  Edison PaceWILKERSON,Augustino Savastano 05/14/2018

## 2018-05-15 MED ORDER — IBUPROFEN 600 MG PO TABS: 600.0000 mg | ORAL_TABLET | Freq: Four times a day (QID) | ORAL | 0 refills | Status: AC

## 2018-05-15 NOTE — Lactation Note (Signed)
This note was copied from a baby's chart. Lactation Consultation Note  Patient Name: Tracy Barr UJWJX'BToday's Date: 05/15/2018 Reason for consult: Initial assessment;Term   Initial assessment with Exp mom of 20 hour old infant. Infant with 7 BF for 15-60 minutes, 1 BF attempt, 1 void and 3 stools (per mom) since birth. LATCH scores 8-10. Infant weight 7 pounds 9.3 ounces with weight loss of 2% since birth.   Mom reports infant is feeding well. She reports she is able to hand express colostrum. Mom reports she is still BF her almost 613 yo. Enc mom to offer the infant the breast before offering the older child the breast. Reminded mom to remember infant is a NB and use the cross cradle hold or football hold with good pillow and head support with feedings. Mom denies nipple pain with feedings. Nipple care discussed with mom.   Enc mom to feed infant STS 8-12 x in 24 hours at first feeding cues offering both breasts with each feeding. Enc mom to keep infant awake at the breast and massage/compress breast with feeding. Reviewed colostrum and milk coming to volume.   BF Resources handout and LC Brochure given, mom informed of IP/OP services, BF Support Groups and LC phone #. Enc mom to call with questions/concerns as needed.   Manual pump given and shown how to assemble, disassemble and clean pump. Mom asked about tubing for her PIS at home, [facility]   Mom reports she has no questions/concerns at this time. Mom to call out for feeding assistance as needed.    Maternal Data Formula Feeding for Exclusion: No Has patient been taught Hand Expression?: Yes Does the patient have breastfeeding experience prior to this delivery?: Yes  Feeding    LATCH Score                   Interventions Interventions: Breast feeding basics reviewed;Support pillows;Position options;Skin to skin;Breast massage;Breast compression;Hand express;Hand pump;Expressed  milk  Lactation Tools Discussed/Used WIC Program: No Pump Review: Milk Storage   Consult Status Consult Status: Complete    Silas FloodSharon S Jaleeyah Munce 05/15/2018, 1:16 PM

## 2018-05-15 NOTE — Discharge Summary (Signed)
Physician Discharge Summary  Patient ID: Tracy Barr MRN: [id number] DOB/AGE: 25/07/1991 25 y.o.  Admit date: 05/14/2018 Discharge date: 05/15/2018  Admission Diagnoses:  39.3WKS CTX   Discharge Diagnoses: Term pregnancy delivered  Hospital Course: onset of labor with vaginal delivery   Discharge Exam: Vitals Blood pressure 117/71, pulse 82, temperature 98.4 F (36.9 C), temperature source Oral, resp. rate (!) 21, height 5\' 3"  (1.6 m), weight 87.1 kg (192 lb), last menstrual period 08/11/2017, SpO2 99 %, unknown if currently breastfeeding.  Physical Exam General appearance: alert Resp: clear to auscultation bilaterally Cardio: regular rate and rhythm, S1, S2 normal, no murmur, click, rub or gallop Skin: Skin color, texture, turgor normal. No rashes or lesions on extremities Abdominal: soft, non-tender, no discomfort with palpation Lochia: bleeding slightly more than normal menstruation w. clots Uterine Fundus: firm,   DVT Evaluation: No evidence of DVT seen on physical exam. Disposition: alert, cooperative, nad, appropriate/positive affect  Assessment 27yo G4P202 PPD1, SVD (waterbirth) of viable female, 2nd degree laceration, recovering well.  Plan Baby Feeding: Breast If it will not delay discharge, mom is breastfeeding well but  would like to meet with lactation nurse Contraception: None Outpatient follow up: in 6 weeks    05/15/2018 Ronnie Dosshika I Tracy Barr, Medical Student    Signed: Ronnie Dosshika I Effa Yarrow 05/15/2018, 8:34 AM

## 2018-05-15 NOTE — Discharge Summary (Signed)
OB Discharge Summary     Patient Name: Tracy Barr DOB: 1991-03-04 MRN: [id number]  Date of admission: 05/14/2018 Delivering MD: Thressa Sheller D   Date of discharge: 05/15/2018  Admitting diagnosis: 39.3WKS CTX Intrauterine pregnancy: [redacted]w[redacted]d     Secondary diagnosis:  Active Problems:   Supervision of other normal pregnancy, antepartum   Late prenatal care affecting pregnancy in third trimester   History of herpes genitalis   Vitamin D deficiency   Anemia during pregnancy in third trimester   Labor and delivery indication for care or intervention  Additional problems: None     Discharge diagnosis: Term Pregnancy Delivered                                                                                                Post partum procedures:None  Augmentation: none  Complications: None  Hospital course:  Onset of Labor With Vaginal Delivery     27 y.o. yo [id number] at [redacted]w[redacted]d was admitted in Active Labor on 05/14/2018. Patient had an uncomplicated labor course as follows:  Membrane Rupture Time/Date: 1:24 PM ,05/14/2018   Intrapartum Procedures: Episiotomy: None [1]                                         Lacerations:  2nd degree [3];Perineal [11]  Patient had a delivery of a Viable infant via waterbirth. 05/14/2018  Information for the patient's newborn:  Tracy, Barr [[id number]]       Pateint had an uncomplicated postpartum course.  She is ambulating, tolerating a regular diet, passing flatus, and urinating well. Patient is discharged home in stable condition on 05/15/18.   Physical exam  Vitals:   05/14/18 1833 05/14/18 1948 05/15/18 0002 05/15/18 0513  BP: (!) 104/57 118/60 112/63 117/71  Pulse: 90 91 (!) 103 82  Resp: 20 18 17  (!) 21  Temp: 98.3 F (36.8 C) 98.7 F (37.1 C) 98 F (36.7 C) 98.4 F (36.9 C)  TempSrc: Oral Oral Oral Oral  SpO2:  98% 98% 99%  Weight:      Height:       General: alert, cooperative and no distress Lochia:  appropriate Uterine Fundus: firm Incision: N/A DVT Evaluation: No evidence of DVT seen on physical exam. No cords or calf tenderness. No significant calf/ankle edema. Labs: Lab Results  Component Value Date   WBC 14.9 (H) 05/14/2018   HGB 10.7 (L) 05/14/2018   HCT 32.0 (L) 05/14/2018   MCV 91.7 05/14/2018   PLT 190 05/14/2018   CMP Latest Ref Rng & Units 05/24/2009  Glucose 70 - 99 mg/dL 528(U)  BUN 6 - 23 mg/dL 4(L)  Creatinine 0.4 - 1.2 mg/dL 1.32  Sodium 440 - 102 mEq/L 141  Potassium 3.5 - 5.1 mEq/L 3.5  Chloride 96 - 112 mEq/L 105  CO2 19 - 32 mEq/L 27  Calcium 8.4 - 10.5 mg/dL 8.5  Total Protein 6.0 - 8.3 g/dL 7.2(Z)  Total Bilirubin 0.3 - 1.2 mg/dL 0.7  Alkaline Phos  47 - 119 U/L 152(H)  AST 0 - 37 U/L 255(H)  ALT 0 - 35 U/L 383(H)    Discharge instruction: per After Visit Summary and "Baby and Me Booklet".  After visit meds:  Allergies as of 05/15/2018   No Known Allergies     Medication List    STOP taking these medications   valACYclovir 500 MG tablet Commonly known as:  VALTREX     TAKE these medications   ibuprofen 600 MG tablet Commonly known as:  ADVIL,MOTRIN Take 1 tablet (600 mg total) by mouth every 6 (six) hours.   prenatal multivitamin Tabs tablet Take 1 tablet by mouth daily at 12 noon.       Diet: routine diet  Activity: Advance as tolerated. Pelvic rest for 6 weeks.   Outpatient follow up:6 weeks Follow up Appt:No future appointments. Follow up Visit: Follow-up Information    [facility]. Schedule an appointment as soon as possible for a visit.   Specialty:  Obstetrics and Gynecology Why:  For postpartum visit Contact information: [address], [address] [place] [id number] [phone]          Postpartum contraception: None  Newborn Data: Live born female  Birth Weight: 7 lb 11.6 oz (3505 g) APGAR: 8, 9  Newborn Delivery   Birth date/time:  05/14/2018  16:11:00 Delivery type:  Vaginal, Spontaneous     Baby Feeding: Breast Disposition:home with mother   05/15/2018 Tracy AdaJazma Krist Rosenboom, DO

## 2018-05-15 NOTE — Discharge Instructions (Signed)

## 2018-07-08 ENCOUNTER — Ambulatory Visit: Payer: Medicaid Other | Admitting: Family Medicine

## 2018-07-08 ENCOUNTER — Encounter: Payer: Self-pay | Admitting: Family Medicine

## 2018-07-08 ENCOUNTER — Encounter: Payer: Self-pay | Admitting: *Deleted

## 2018-07-08 NOTE — Progress Notes (Signed)
Patient did not keep appointment today. She may call to reschedule.  

## 2018-12-24 ENCOUNTER — Ambulatory Visit (INDEPENDENT_AMBULATORY_CARE_PROVIDER_SITE_OTHER): Payer: Medicaid Other | Admitting: Certified Nurse Midwife

## 2018-12-24 ENCOUNTER — Encounter: Payer: Self-pay | Admitting: Certified Nurse Midwife

## 2018-12-24 VITALS — BP 122/74 | HR 73 | Wt 146.0 lb

## 2018-12-24 DIAGNOSIS — Z3202 Encounter for pregnancy test, result negative: Secondary | ICD-10-CM | POA: Diagnosis not present

## 2018-12-24 DIAGNOSIS — Z30015 Encounter for initial prescription of vaginal ring hormonal contraceptive: Secondary | ICD-10-CM

## 2018-12-24 DIAGNOSIS — Z3009 Encounter for other general counseling and advice on contraception: Secondary | ICD-10-CM

## 2018-12-24 LAB — POCT URINE PREGNANCY: Preg Test, Ur: NEGATIVE

## 2018-12-24 MED ORDER — ETONOGESTREL-ETHINYL ESTRADIOL 0.12-0.015 MG/24HR VA RING: VAGINAL_RING | VAGINAL | 3 refills | Status: DC

## 2018-12-24 NOTE — Progress Notes (Signed)
History:  Ms. Tracy Barr is a 29 y.o. S8P1031 who presents to clinic today for birth control counseling. She reports not being on any birth control since 2018. She has been on the NuvaRing before but is unsure if she wants that or pills. She denies abnormal vaginal bleeding, discharge, pelvic pain, pain with intercourse or any other gyn concerns. She is currently breastfeeding her 27 month old.    The following portions of the patient's history were reviewed and updated as appropriate: allergies, current medications, family history, past medical history, social history, past surgical history and problem list.  Review of Systems:  Review of Systems  Constitutional: Negative.   Respiratory: Negative.   Cardiovascular: Negative.   Gastrointestinal: Negative.   Genitourinary: Negative.   Neurological: Negative.     Objective:  Physical Exam BP 122/74   Pulse 73   Wt 146 lb (66.2 kg)   LMP 11/26/2018   BMI 25.86 kg/m  Physical Exam Constitutional:      Appearance: Normal appearance. She is normal weight.  Cardiovascular:     Rate and Rhythm: Normal rate and regular rhythm.     Pulses: Normal pulses.     Heart sounds: Normal heart sounds. No murmur.  Pulmonary:     Effort: Pulmonary effort is normal. No respiratory distress.     Breath sounds: Normal breath sounds. No wheezing.  Abdominal:     General: There is no distension.     Palpations: Abdomen is soft.     Tenderness: There is no abdominal tenderness.  Skin:    General: Skin is warm and dry.  Neurological:     Mental Status: She is alert and oriented to person, place, and time.  Psychiatric:        Mood and Affect: Mood normal.        Behavior: Behavior normal.        Thought Content: Thought content normal.    Labs and Imaging Results for orders placed or performed in visit on 12/24/18 (from the past 24 hour(s))  POCT urine pregnancy     Status: None   Collection Time: 12/24/18  3:02 PM  Result Value Ref  Range   Preg Test, Ur Negative Negative   Assessment & Plan:  1. Birth control counseling - Discussed birth control options in detail with patient from OCPs to LARCs  - Patient does not want birth control method she can not stop using or take out without coming to office, does not want Nexplanon or IUD  - Educated on OCPs and need to take medication daily at the same time, patient does not believe she will take medication daily due to have two young children  - Patient request NuvaRing for birth control, educated on keeping method in place for 3 weeks removing for 1 week to have cycle then replacing with new ring, patient verbalizes understanding   2. Encounter for initial prescription of vaginal ring hormonal contraceptive - Pregnancy test in office today negative  - POCT urine pregnancy - etonogestrel-ethinyl estradiol (NUVARING) 0.12-0.015 MG/24HR vaginal ring; Insert vaginally and leave in place for 3 consecutive weeks, then remove for 1 week.  Dispense: 3 each; Refill: 3   Sharyon Cable, [place] 12/24/2018 3:08 PM

## 2018-12-24 NOTE — Patient Instructions (Signed)
Ethinyl Estradiol; Etonogestrel vaginal ring  What is this medicine?  ETHINYL ESTRADIOL; ETONOGESTREL (ETH in il es tra DYE ole; et oh noe JES trel) vaginal ring is a flexible, vaginal ring used as a contraceptive (birth control method). This medicine combines 2 types of female hormones, an estrogen and a progestin. This ring is used to prevent ovulation and pregnancy. Each ring is effective for 1 month.  This medicine may be used for other purposes; ask your health care provider or pharmacist if you have questions.  COMMON BRAND NAME(S): NuvaRing  What should I tell my health care provider before I take this medicine?  They need to know if you have any of these conditions:  -abnormal vaginal bleeding  -blood vessel disease or blood clots  -breast, cervical, endometrial, ovarian, liver, or uterine cancer  -diabetes  -gallbladder disease  -having surgery  -heart disease or recent heart attack  -high blood pressure  -high cholesterol or triglycerides  -history of irregular heartbeat or heart valve problems  -kidney disease  -liver disease  -migraine headaches  -protein C deficiency  -protein S deficiency  -recently had a baby, miscarriage, or abortion  -stroke  -systemic lupus erythematosus (SLE)  -tobacco smoker  -your age is more than 26 years old  -an unusual or allergic reaction to estrogens, progestins, other medicines, foods, dyes, or preservatives  -pregnant or trying to get pregnant  -breast-feeding  How should I use this medicine?  Insert the ring into your vagina as directed. Follow the directions on the prescription label. The ring will remain place for 3 weeks and is then removed for a 1-week break. A new ring is inserted 1 week after the last ring was removed, on the same day of the week. Check often to make sure the ring is still in place. If the ring was out of the vagina for an unknown amount of time, you may not be protected from pregnancy. Perform a pregnancy test and call your doctor. Do not use  more often than directed.  A patient package insert for the product will be given with each prescription and refill. Read this sheet carefully each time. The sheet may change frequently.  Contact your pediatrician regarding the use of this medicine in children. Special care may be needed.  Overdosage: If you think you have taken too much of this medicine contact a poison control center or emergency room at once.  NOTE: This medicine is only for you. Do not share this medicine with others.  What if I miss a dose?  You will need to use the ring exactly as directed. It is very important to follow the schedule every cycle. If you do not use the ring as directed, you may not be protected from pregnancy. If the ring should slip out, is lost, or if you leave it in longer or shorter than you should, contact your health care professional for advice.  What may interact with this medicine?  Do not take this medicine with the following medications:  -dasabuvir; ombitasvir; paritaprevir; ritonavir  -ombitasvir; paritaprevir; ritonavir  -vaginal lubricants or other vaginal products that are oil-based or silicone-based  This medicine may also interact with the following medications:  -acetaminophen  -antibiotics or medicines for infections, especially rifampin, rifabutin, rifapentine, and griseofulvin, and possibly penicillins or tetracyclines  -aprepitant or fosaprepitant  -armodafinil  -ascorbic acid (vitamin C)  -barbiturate medicines, such as phenobarbital or primidone  -bosentan  -certain antiviral medicines for hepatitis, HIV or AIDS  -certain   medicines for cancer treatment  -certain medicines for seizures like carbamazepine, clobazam, felbamate, lamotrigine, oxcarbazepine, phenytoin, rufinamide, topiramate  -certain medicines for treating high cholesterol  -cyclosporine  -dantrolene  -elagolix  -flibanserin  -grapefruit juice  -lesinurad  -medicines for diabetes  -medicines to treat fungal infections, such as griseofulvin,  miconazole, fluconazole, ketoconazole, itraconazole, posaconazole or voriconazole  -mifepristone  -mitotane  -modafinil  -morphine  -mycophenolate  -St. John's wort  -tamoxifen  -temazepam  -theophylline or aminophylline  -thyroid hormones  -tizanidine  -tranexamic acid  -ulipristal  -warfarin  This list may not describe all possible interactions. Give your health care provider a list of all the medicines, herbs, non-prescription drugs, or dietary supplements you use. Also tell them if you smoke, drink alcohol, or use illegal drugs. Some items may interact with your medicine.  What should I watch for while using this medicine?  Visit your doctor or health care professional for regular checks on your progress. You will need a regular breast and pelvic exam and Pap smear while on this medicine.  Check with your doctor or health care professional to see if you need an additional method of contraception during the first cycle that you use this ring. Female condoms (made with natural rubber latex, polyisoprene, and polyurethane) and spermicides may be used. Do not use a diaphragm, cervical cap, or a female condom, as the ring can interfere with these birth control methods and their proper placement.  If you have any reason to think you are pregnant, stop using this medicine right away and contact your doctor or health care professional.  If you are using this medicine for hormone related problems, it may take several cycles of use to see improvement in your condition.  Smoking increases the risk of getting a blood clot or having a stroke while you are using hormonal birth control, especially if you are more than 26 years old. You are strongly advised not to smoke.  Some women are prone to getting dark patches on the skin of the face (cholasma). Your risk of getting chloasma with this medicine is higher if you had chloasma during a pregnancy. Keep out of the sun. If you cannot avoid being in the sun, wear protective  clothing and use sunscreen. Do not use sun lamps or tanning beds/booths.  This medicine can make your body retain fluid, making your fingers, hands, or ankles swell. Your blood pressure can go up. Contact your doctor or health care professional if you feel you are retaining fluid.  If you are going to have elective surgery, you may need to stop using this medicine before the surgery. Consult your health care professional for advice.  This medicine does not protect you against HIV infection (AIDS) or any other sexually transmitted diseases.  What side effects may I notice from receiving this medicine?  Side effects that you should report to your doctor or health care professional as soon as possible:  -allergic reactions such as skin rash or itching, hives, swelling of the lips, mouth, tongue, or throat  -depression  -high blood pressure  -migraines or severe, sudden headaches  -signs and symptoms of a blood clot such as breathing problems; changes in vision; chest pain; severe, sudden headache; pain, swelling, warmth in the leg; trouble speaking; sudden numbness or weakness of the face, arm or leg  -signs and symptoms of infection like fever or chills with dizziness and a sunburn-like rash, or pain or trouble passing urine  -stomach pain  -symptoms of   vaginal infection like itching, irritation or unusual discharge  -yellowing of the eyes or skin  Side effects that usually do not require medical attention (report these to your doctor or health care professional if they continue or are bothersome):  -acne  -breast pain, tenderness  -irregular vaginal bleeding or spotting, particularly during the first month of use  -mild headache  -nausea  -painful periods  -vomiting  This list may not describe all possible side effects. Call your doctor for medical advice about side effects. You may report side effects to FDA at 1-800-FDA-1088.  Where should I keep my medicine?  Keep out of the reach of children.  Store unopened  rings in the original foil pouch at room temperature between 20 and 25 degrees C (68 and 77 degrees F) for up to 4 months. Protect from light. Do not store above 30 degrees C (86 degrees F). Throw away any unused medicine after the expiration date. A ring may only be used for 1 cycle (1 month). After the 3-week cycle, a used ring is removed and should be placed in the re-closable foil pouch and discarded in the trash out of reach of children and pets. Do NOT flush down the toilet.  NOTE: This sheet is a summary. It may not cover all possible information. If you have questions about this medicine, talk to your doctor, pharmacist, or health care provider.  © 2019 [employer] (2017-06-21 14:41:10)

## 2019-01-08 IMAGING — US US MFM OB FOLLOW-UP
1 series · 14 of 28 positions shown · non-contrast
Comparison: none

[Series 1: us mfm ob follow-up · 68 acquisitions, 14 frames shown]
[im 3/68]
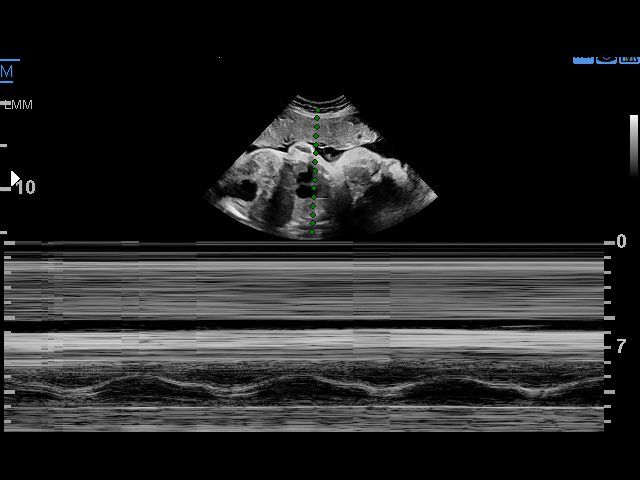
[im 8/68]
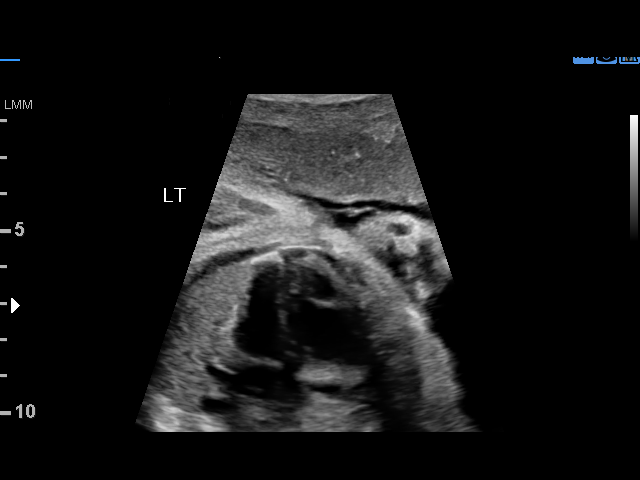
[im 13/68]
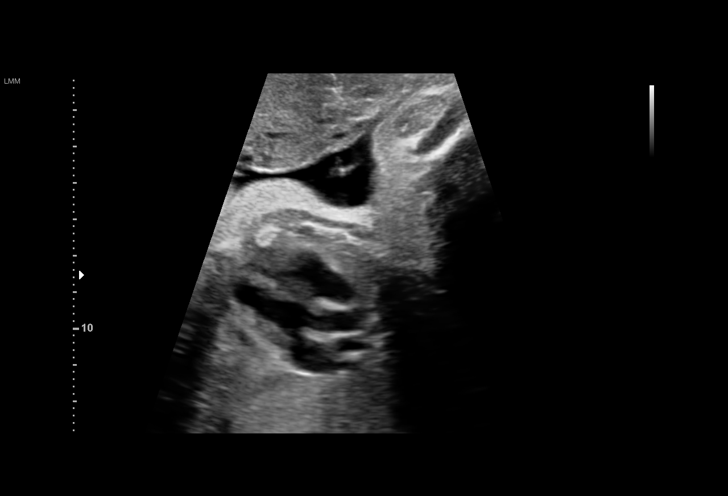
[im 18/68]
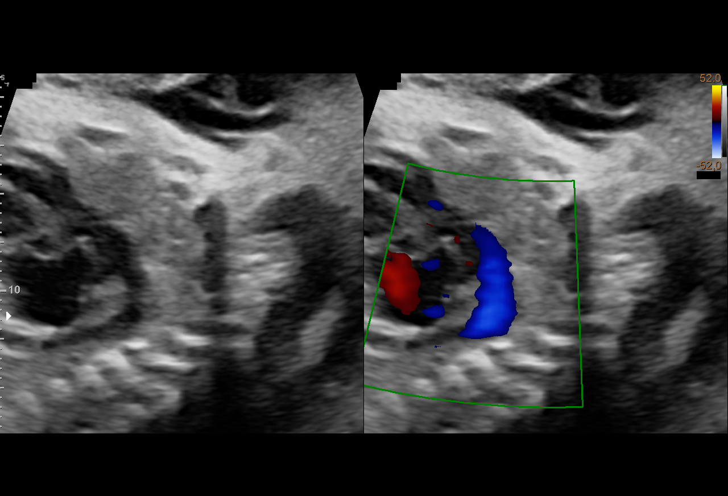
[im 23/68]
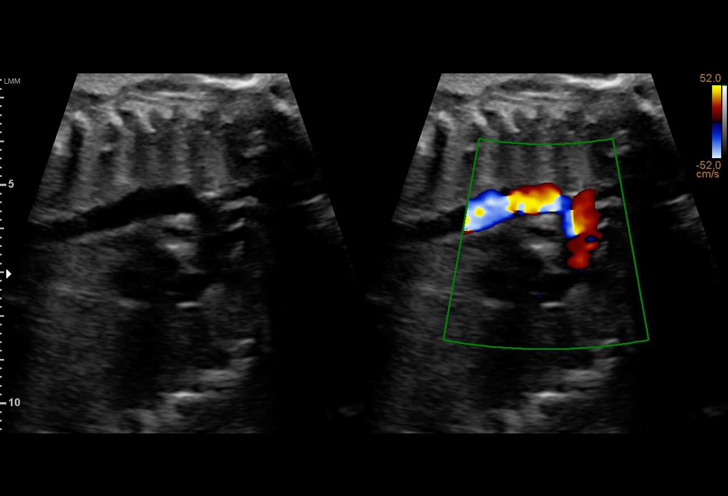
[im 28/68]
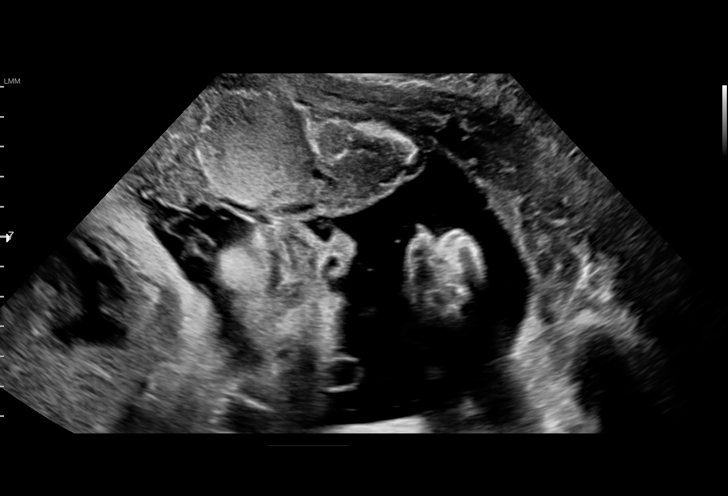
[im 33/68]
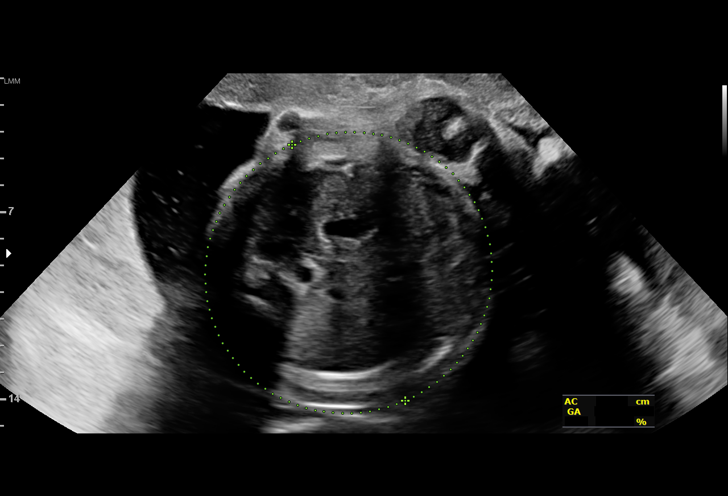
[im 38/68]
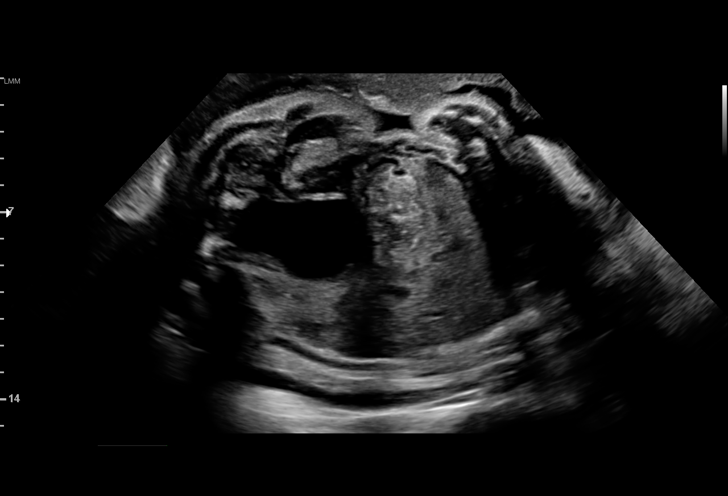
[im 43/68]
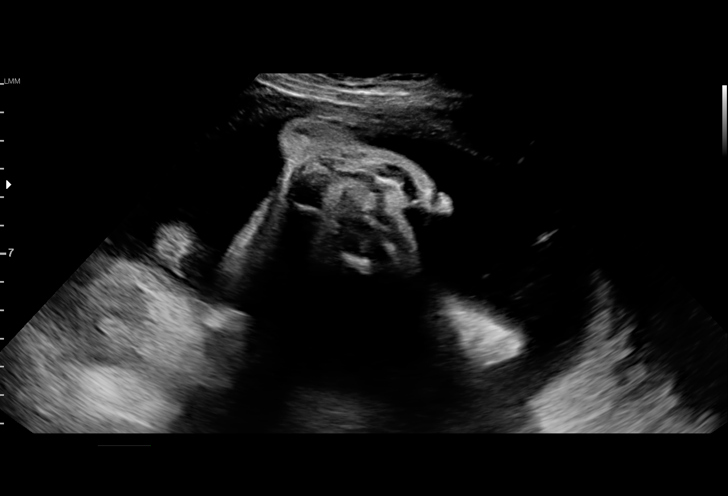
[im 48/68]
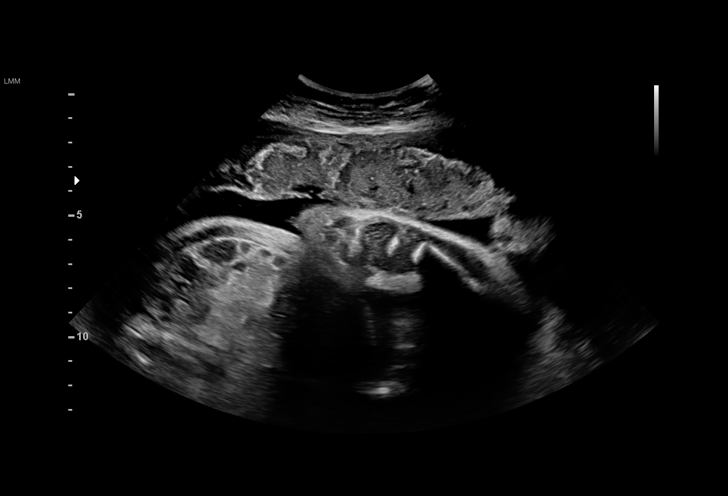
[im 53/68]
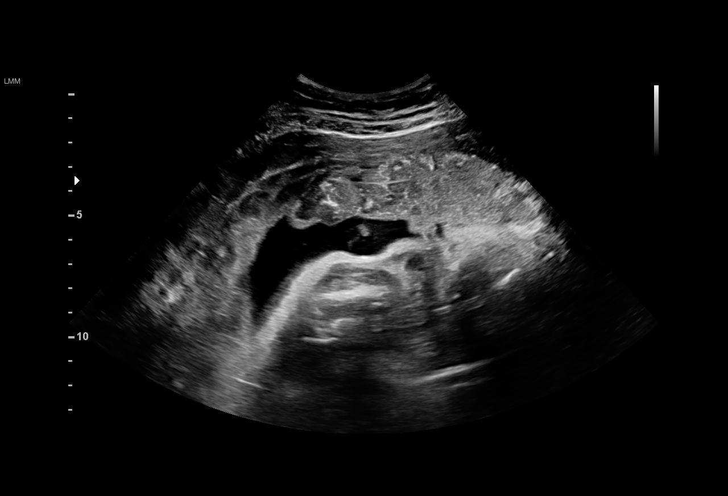
[im 58/68]
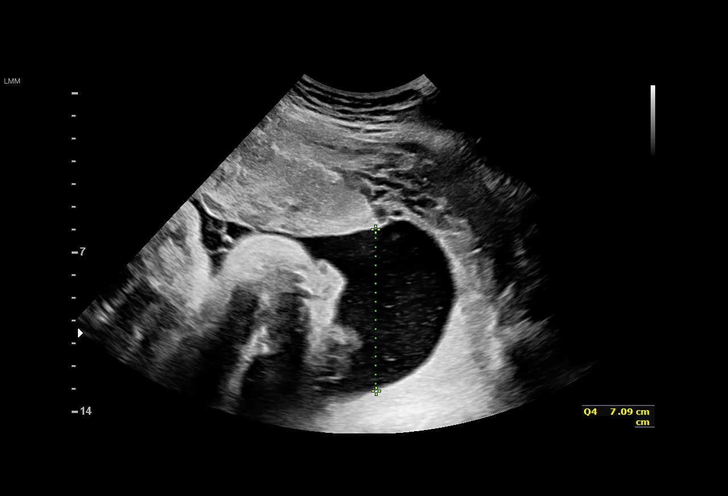
[im 63/68]
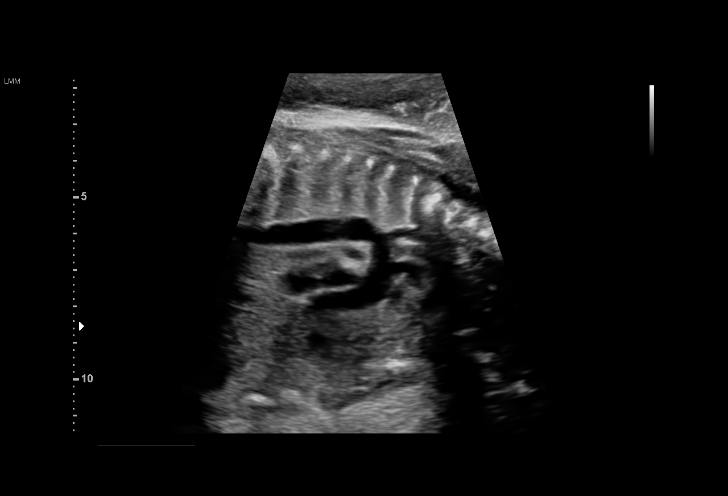
[im 68/68]
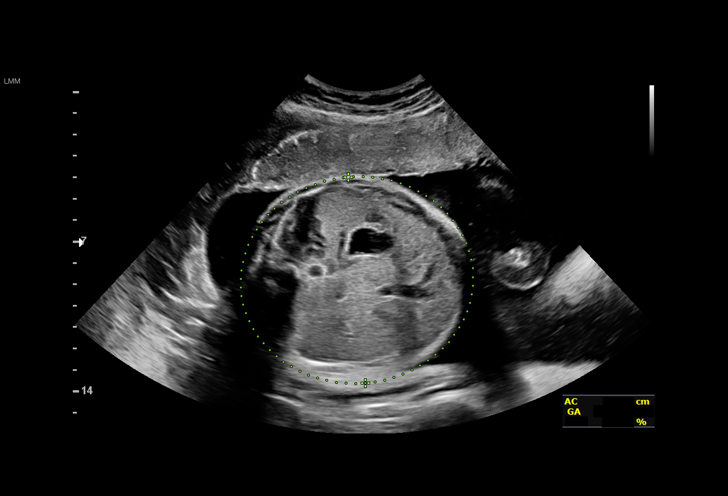

[14 of 28 positions shown; findings below may reference images not displayed]

Road [HOSPITAL]

Indications

34 weeks gestation of pregnancy
Antenatal follow-up for nonvisualized fetal
anatomy
Late to prenatal care, third trimester
OB History

Blood Type:            Height:  5'4"   Weight (lb):  177       BMI:
Gravidity:    4         Term:   1         SAB:   1
TOP:          1        Living:  1
Fetal Evaluation

Num Of Fetuses:     1
Fetal Heart         113
Rate(bpm):
Cardiac Activity:   Observed
Presentation:       Cephalic
Placenta:           Anterior, above cervical os
P. Cord Insertion:  Previously Visualized

Amniotic Fluid
AFI FV:      Subjectively upper-normal

AFI Sum(cm)     %Tile       Largest Pocket(cm)
24.15           92

RUQ(cm)       RLQ(cm)       LUQ(cm)        LLQ(cm)
4.78
Biometry
BPD:      93.2  mm     G. Age:  37w 6d       > 99  %    CI:        81.26   %    70 - 86
FL/HC:      18.2   %    20.1 -
HC:      326.4  mm     G. Age:  37w 0d         73  %    HC/AC:      1.00        0.93 -
AC:      325.1  mm     G. Age:  36w 3d         92  %    FL/BPD:     63.7   %    71 - 87
FL:       59.4  mm     G. Age:  31w 0d        < 3  %    FL/AC:      18.3   %    20 - 24
HUM:      55.1  mm     G. Age:  32w 0d          9  %

Est. FW:    1875  gm    5 lb 13 oz      72  %
Gestational Age

LMP:           34w 5d        Date:  08/11/17                 EDD:   05/18/18
U/S Today:     35w 4d                                        EDD:   05/12/18
Best:          34w 5d     Det. By:  LMP  (08/11/17)          EDD:   05/18/18
Anatomy

Cranium:               Appears normal         Aortic Arch:            Appears normal
Cavum:                 Appears normal         Ductal Arch:            Appears normal
Ventricles:            Appears normal         Diaphragm:              Appears normal
Choroid Plexus:        Previously seen        Stomach:                Appears normal, left
sided
Cerebellum:            Previously seen        Abdomen:                Appears normal
Posterior Fossa:       Previously seen        Abdominal Wall:         Previously seen
Nuchal Fold:           Not applicable (>20    Cord Vessels:           Previously seen
wks GA)
Face:                  Appears normal         Kidneys:                Appear normal
(orbits and profile)
Lips:                  Appears normal         Bladder:                Appears normal
Thoracic:              Appears normal         Spine:                  Previously seen
Heart:                 Appears normal         Upper Extremities:      Previously seen
(4CH, axis, and situs
RVOT:                  Appears normal         Lower Extremities:      Previously seen
LVOT:                  Appears normal

Other:  Heels previously visualized.
Impression

Single living intrauterine pregnancy at 34w 5d.
Cephalic presentation.
Placenta Anterior, above cervical os.
Appropriate fetal growth.
Normal amniotic fluid volume.
The fetal anatomic survey is complete.
Normal fetal anatomy.
No fetal anomalies or soft markers of aneuploidy seen.
Recommendations

Follow-up ultrasounds as clinically indicated.

## 2019-06-17 ENCOUNTER — Ambulatory Visit: Payer: Medicaid Other

## 2019-06-24 ENCOUNTER — Ambulatory Visit (INDEPENDENT_AMBULATORY_CARE_PROVIDER_SITE_OTHER): Payer: Medicaid Other

## 2019-06-24 ENCOUNTER — Other Ambulatory Visit: Payer: Self-pay

## 2019-06-24 ENCOUNTER — Other Ambulatory Visit (HOSPITAL_COMMUNITY)
Admission: RE | Admit: 2019-06-24 | Discharge: 2019-06-24 | Disposition: A | Discharge status: Home / Self Care | Payer: Medicaid Other | Source: Ambulatory Visit | Attending: Obstetrics | Admitting: Obstetrics

## 2019-06-24 DIAGNOSIS — Z113 Encounter for screening for infections with a predominantly sexual mode of transmission: Secondary | ICD-10-CM

## 2019-06-24 NOTE — Progress Notes (Signed)
Pt is here with desire to have STD screening. Pt instructed on how to do self swab and blood work ordered. Pt advised she will be notified of results, she verbalizes understanding.

## 2019-06-25 LAB — RPR: RPR Ser Ql: NONREACTIVE

## 2019-06-25 LAB — HEPATITIS B SURFACE ANTIGEN: Hepatitis B Surface Ag: NEGATIVE

## 2019-06-25 LAB — HIV ANTIBODY (ROUTINE TESTING W REFLEX): HIV Screen 4th Generation wRfx: NONREACTIVE

## 2019-06-25 LAB — CERVICOVAGINAL ANCILLARY ONLY
Bacterial vaginitis: NEGATIVE
Candida vaginitis: POSITIVE — AB
Trichomonas: NEGATIVE

## 2019-06-25 LAB — HEPATITIS C ANTIBODY: Hep C Virus Ab: <0.1 s/co ratio (ref 0.0–0.9)

## 2019-06-26 ENCOUNTER — Other Ambulatory Visit: Payer: Self-pay | Admitting: Obstetrics

## 2019-06-26 DIAGNOSIS — B3731 Acute candidiasis of vulva and vagina: Secondary | ICD-10-CM

## 2019-06-26 DIAGNOSIS — B373 Candidiasis of vulva and vagina: Secondary | ICD-10-CM

## 2019-06-26 MED ORDER — TERCONAZOLE 0.8 % VA CREA: 1.0000 | TOPICAL_CREAM | Freq: Every day | VAGINAL | 0 refills | Status: AC

## 2020-03-28 ENCOUNTER — Other Ambulatory Visit: Payer: Self-pay | Admitting: *Deleted

## 2020-03-28 DIAGNOSIS — Z30015 Encounter for initial prescription of vaginal ring hormonal contraceptive: Secondary | ICD-10-CM

## 2020-03-28 MED ORDER — ETONOGESTREL-ETHINYL ESTRADIOL 0.12-0.015 MG/24HR VA RING: VAGINAL_RING | VAGINAL | 0 refills | Status: DC

## 2020-03-28 NOTE — Progress Notes (Signed)
Pt called office needing refill on BC.  Refill sent today and pt has been scheduled for AEX.

## 2020-05-02 ENCOUNTER — Ambulatory Visit: Payer: Medicaid Other | Admitting: Obstetrics and Gynecology

## 2020-06-03 ENCOUNTER — Ambulatory Visit: Payer: Medicaid Other | Admitting: Certified Nurse Midwife

## 2020-06-03 ENCOUNTER — Other Ambulatory Visit: Payer: Self-pay

## 2020-06-03 DIAGNOSIS — Z30015 Encounter for initial prescription of vaginal ring hormonal contraceptive: Secondary | ICD-10-CM

## 2020-06-03 MED ORDER — ETONOGESTREL-ETHINYL ESTRADIOL 0.12-0.015 MG/24HR VA RING: VAGINAL_RING | VAGINAL | 0 refills | Status: DC

## 2020-06-03 NOTE — Progress Notes (Signed)
Rx for Birth control Sent until pt is seen for Annual for 1 yr refills.

## 2020-06-20 ENCOUNTER — Ambulatory Visit: Payer: Medicaid Other | Admitting: Advanced Practice Midwife
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
## 2020-07-28 ENCOUNTER — Other Ambulatory Visit: Payer: Self-pay

## 2020-07-28 DIAGNOSIS — Z30015 Encounter for initial prescription of vaginal ring hormonal contraceptive: Secondary | ICD-10-CM

## 2020-07-28 MED ORDER — ETONOGESTREL-ETHINYL ESTRADIOL 0.12-0.015 MG/24HR VA RING: VAGINAL_RING | VAGINAL | 0 refills | Status: DC

## 2020-07-28 NOTE — Progress Notes (Signed)
Birth control refill sent to pt preferred pharmacy to get her through until annual exam appt.

## 2020-08-24 ENCOUNTER — Encounter: Payer: Self-pay | Admitting: Obstetrics and Gynecology

## 2020-08-24 ENCOUNTER — Ambulatory Visit (INDEPENDENT_AMBULATORY_CARE_PROVIDER_SITE_OTHER): Payer: Medicaid Other | Admitting: Obstetrics and Gynecology

## 2020-08-24 ENCOUNTER — Other Ambulatory Visit: Payer: Self-pay

## 2020-08-24 VITALS — BP 109/65 | HR 81 | Wt 145.8 lb

## 2020-08-24 DIAGNOSIS — Z3040 Encounter for surveillance of contraceptives, unspecified: Secondary | ICD-10-CM | POA: Diagnosis not present

## 2020-08-24 DIAGNOSIS — Z30015 Encounter for initial prescription of vaginal ring hormonal contraceptive: Secondary | ICD-10-CM

## 2020-08-24 DIAGNOSIS — Z01419 Encounter for gynecological examination (general) (routine) without abnormal findings: Secondary | ICD-10-CM

## 2020-08-24 DIAGNOSIS — Z0000 Encounter for general adult medical examination without abnormal findings: Secondary | ICD-10-CM | POA: Diagnosis not present

## 2020-08-24 MED ORDER — ETONOGESTREL-ETHINYL ESTRADIOL 0.12-0.015 MG/24HR VA RING: VAGINAL_RING | VAGINAL | 4 refills | Status: AC

## 2020-08-24 NOTE — Progress Notes (Signed)
Pt here for renewal of b/c and annual exam Last pap: 02/20/2018 - Would like to defer from pap today

## 2020-08-24 NOTE — Progress Notes (Signed)
   WELL-WOMAN PHYSICAL Patient name: Tracy Barr MRN [id number]  Date of birth: 12/21/1990 Chief Complaint:   Gynecologic Exam  History of Present Illness:   Tracy Barr is a 29 y.o. [id number] Biracial female being seen today for a routine well-woman exam. She declines pelvic exam today. She is only here to renew her Nuvaring Rx. Current complaints: none  PCP: none      does not desire labs No LMP recorded. The current method of family planning is NuvaRing vaginal inserts.  Last pap 2019. Results were: normal Last mammogram: n/a. Results were: n/a. Family h/o breast cancer: No Last colonoscopy: n/a. Results were: n/a. Family h/o colorectal cancer: No Review of Systems:   Pertinent items are noted in HPI Denies any headaches, blurred vision, fatigue, shortness of breath, chest pain, abdominal pain, abnormal vaginal discharge/itching/odor/irritation, problems with periods, bowel movements, urination, or intercourse unless otherwise stated above. Pertinent History Reviewed:  Reviewed past medical,surgical, social and family history.  Reviewed problem list, medications and allergies. Physical Assessment:   Vitals:   08/24/20 1508  BP: 109/65  Pulse: 81  Weight: 145 lb 12.8 oz (66.1 kg)  Body mass index is 25.83 kg/m.        Physical Examination:   General appearance - well appearing, and in no distress  Mental status - alert, oriented to person, place, and time  Psych:  She has a normal mood and affect  Skin - warm and dry, normal color, no suspicious lesions noted  Chest - effort normal, all lung fields clear to auscultation bilaterally  Heart - normal rate and regular rhythm  Neck:  midline trachea, no thyromegaly or nodules  Breasts - breasts appear normal, no suspicious masses, no skin or nipple changes or  axillary nodes  Abdomen - soft, nontender, nondistended, no masses or organomegaly  Pelvic - deferred per patient request  Extremities:  No swelling or  varicosities noted  No results found for this or any previous visit (from the past 24 hour(s)).  Assessment & Plan:  1) Well woman exam without gynecological exam  2) Encounter for surveillance of contraceptive device  - Plan: etonogestrel-ethinyl estradiol (NUVARING) 0.12-0.015 MG/24HR vaginal ring   Labs/procedures today: none  Mammogram at age 89 or sooner if problems Colonoscopy at age 29 or sooner if problems  No orders of the defined types were placed in this encounter.   Meds:  Meds ordered this encounter  Medications  . etonogestrel-ethinyl estradiol (NUVARING) 0.12-0.015 MG/24HR vaginal ring    Sig: Insert vaginally and leave in place for 3 consecutive weeks, then remove for 1 week.    Dispense:  3 each    Refill:  4    Order Specific Question:   Supervising Provider    Answer:   Reva Bores [[id number]]    Follow-up: Return in about 1 year (around 08/24/2021) for Annual Exam with Pap.  Raelyn Mora MSN, CNM 08/24/2020 3:48 PM
